# Patient Record
Sex: Male | Born: 1945 | Race: White | Hispanic: No | Marital: Married | Smoking: Never smoker
Health system: Southern US, Community
[De-identification: ages and names within clinical notes are randomized; demographics above are authoritative.]

## PROBLEM LIST (undated history)

## (undated) DIAGNOSIS — I10 Essential (primary) hypertension: Secondary | ICD-10-CM

## (undated) DIAGNOSIS — N200 Calculus of kidney: Secondary | ICD-10-CM

## (undated) DIAGNOSIS — F419 Anxiety disorder, unspecified: Secondary | ICD-10-CM

## (undated) DIAGNOSIS — K219 Gastro-esophageal reflux disease without esophagitis: Secondary | ICD-10-CM

## (undated) DIAGNOSIS — E785 Hyperlipidemia, unspecified: Secondary | ICD-10-CM

## (undated) HISTORY — DX: Anxiety disorder, unspecified: F41.9

## (undated) HISTORY — DX: Gastro-esophageal reflux disease without esophagitis: K21.9

## (undated) HISTORY — DX: Calculus of kidney: N20.0

## (undated) HISTORY — DX: Hyperlipidemia, unspecified: E78.5

## (undated) HISTORY — DX: Essential (primary) hypertension: I10

---

## 2009-08-09 HISTORY — PX: EXTRACORPOREAL SHOCK WAVE LITHOTRIPSY: SHX1557

## 2010-01-28 ENCOUNTER — Emergency Department: Payer: Self-pay | Admitting: Emergency Medicine

## 2010-02-12 ENCOUNTER — Ambulatory Visit: Payer: Self-pay | Admitting: Urology

## 2010-02-20 ENCOUNTER — Ambulatory Visit: Payer: Self-pay | Admitting: Urology

## 2010-03-04 ENCOUNTER — Ambulatory Visit: Payer: Self-pay | Admitting: Urology

## 2010-05-11 ENCOUNTER — Ambulatory Visit: Payer: Self-pay | Admitting: General Practice

## 2010-08-17 ENCOUNTER — Ambulatory Visit: Payer: Self-pay | Admitting: Unknown Physician Specialty

## 2011-01-23 ENCOUNTER — Emergency Department: Payer: Self-pay | Admitting: Emergency Medicine

## 2013-10-14 ENCOUNTER — Emergency Department: Payer: Self-pay | Admitting: Emergency Medicine

## 2017-09-07 ENCOUNTER — Emergency Department: Payer: Medicare Other

## 2017-09-07 ENCOUNTER — Emergency Department
Admission: EM | Admit: 2017-09-07 | Discharge: 2017-09-08 | Disposition: A | Discharge status: Home / Self Care | Payer: Medicare Other | Attending: Emergency Medicine | Admitting: Emergency Medicine

## 2017-09-07 ENCOUNTER — Other Ambulatory Visit: Payer: Self-pay

## 2017-09-07 DIAGNOSIS — N200 Calculus of kidney: Secondary | ICD-10-CM | POA: Diagnosis not present

## 2017-09-07 DIAGNOSIS — R1012 Left upper quadrant pain: Secondary | ICD-10-CM | POA: Diagnosis present

## 2017-09-07 LAB — CBC
HEMATOCRIT: 40.5 % (ref 40.0–52.0)
HEMOGLOBIN: 13.8 g/dL (ref 13.0–18.0)
MCH: 30.9 pg (ref 26.0–34.0)
MCHC: 34.2 g/dL (ref 32.0–36.0)
MCV: 90.3 fL (ref 80.0–100.0)
Platelets: 212 10*3/uL (ref 150–440)
RBC: 4.48 MIL/uL (ref 4.40–5.90)
RDW: 13.3 % (ref 11.5–14.5)
WBC: 7.3 10*3/uL (ref 3.8–10.6)

## 2017-09-07 LAB — COMPREHENSIVE METABOLIC PANEL
ALK PHOS: 49 U/L (ref 38–126)
ALT: 13 U/L — ABNORMAL LOW (ref 17–63)
ANION GAP: 9 (ref 5–15)
AST: 26 U/L (ref 15–41)
Albumin: 3.8 g/dL (ref 3.5–5.0)
BILIRUBIN TOTAL: 0.8 mg/dL (ref 0.3–1.2)
BUN: 16 mg/dL (ref 6–20)
CALCIUM: 9 mg/dL (ref 8.9–10.3)
CO2: 23 mmol/L (ref 22–32)
Chloride: 100 mmol/L — ABNORMAL LOW (ref 101–111)
Creatinine, Ser: 1.59 mg/dL — ABNORMAL HIGH (ref 0.61–1.24)
GFR, EST AFRICAN AMERICAN: 48 mL/min — AB (ref >60)
GFR, EST NON AFRICAN AMERICAN: 42 mL/min — AB (ref >60)
GLUCOSE: 121 mg/dL — AB (ref 65–99)
Potassium: 4 mmol/L (ref 3.5–5.1)
Sodium: 132 mmol/L — ABNORMAL LOW (ref 135–145)
TOTAL PROTEIN: 7.1 g/dL (ref 6.5–8.1)

## 2017-09-07 LAB — LIPASE, BLOOD: LIPASE: 111 U/L — AB (ref 11–51)

## 2017-09-07 NOTE — ED Provider Notes (Signed)
[facility] Emergency Department Provider Note  ____________________________________________   First MD Initiated Contact with Patient 09/07/17 2358     (approximate)  I have reviewed the triage vital signs and the nursing notes.   HISTORY  Chief Complaint Flank Pain    HPI Alexander Velazquez is a 72 y.o. male who self presents to the emergency department with sudden onset severe left flank pain radiating to his left groin that began several hours prior to arrival.  Associated with nausea.  Pacing around the room seems to improve the symptoms somewhat nothing in particular seems to make it worse.  He was seen in clinic earlier today and was diagnosed presumptively with a kidney stone and was prescribed ciprofloxacin.  He denies fevers or chills.  He denies dysuria.  He does have a history of renal colic in the past.  This feels similar although the pain is higher up in his back.  No past medical history on file.  There are no active problems to display for this patient.     Prior to Admission medications   Medication Sig Start Date End Date Taking? Authorizing Provider  HYDROcodone-acetaminophen (NORCO) 5-325 MG tablet Take 1 tablet by mouth every 6 (six) hours as needed for up to 15 doses for severe pain. 09/08/17   Merrily Brittleifenbark, Crystale Giannattasio, MD  tamsulosin (FLOMAX) 0.4 MG CAPS capsule Take 1 capsule (0.4 mg total) by mouth daily. 09/08/17   Merrily Brittleifenbark, Riverlyn Kizziah, MD    Allergies Darvon [propoxyphene]  No family history on file.  Social History Social History   Tobacco Use  . Smoking status: Not on file  Substance Use Topics  . Alcohol use: Not on file  . Drug use: Not on file    Review of Systems Constitutional: No fever/chills Eyes: No visual changes. ENT: No sore throat. Cardiovascular: Denies chest pain. Respiratory: Denies shortness of breath. Gastrointestinal: Positive for abdominal pain.  Positive for nausea, no vomiting.  No diarrhea.  No  constipation. Genitourinary: Negative for dysuria. Musculoskeletal: Positive for back pain. Skin: Negative for rash. Neurological: Negative for headaches, focal weakness or numbness.   ____________________________________________   PHYSICAL EXAM:  VITAL SIGNS: ED Triage Vitals  Enc Vitals Group     BP 09/07/17 2136 (!) 151/79     Pulse Rate 09/07/17 2136 81     Resp 09/07/17 2136 20     Temp 09/07/17 2136 97.9 F (36.6 C)     Temp Source 09/07/17 2136 Oral     SpO2 09/07/17 2136 98 %     Weight 09/07/17 2138 166 lb (75.3 kg)     Height 09/07/17 2138 5\' 6"  (1.676 m)     Head Circumference --      Peak Flow --      Pain Score 09/07/17 2147 7     Pain Loc --      Pain Edu? --      Excl. in GC? --     Constitutional: Alert and oriented x4 well-appearing nontoxic no diaphoresis speaks in full clear sentences Eyes: PERRL EOMI. Head: Atraumatic. Nose: No congestion/rhinnorhea. Mouth/Throat: No trismus Neck: No stridor.   Cardiovascular: Normal rate, regular rhythm. Grossly normal heart sounds.  Good peripheral circulation. Respiratory: Normal respiratory effort.  No retractions. Lungs CTAB and moving good air Gastrointestinal: Soft nondistended nontender no rebound or guarding no peritonitis Musculoskeletal: No lower extremity edema   Neurologic:  Normal speech and language. No gross focal neurologic deficits are appreciated. Skin:  Skin is warm,  dry and intact. No rash noted. Psychiatric: Mood and affect are normal. Speech and behavior are normal.    ____________________________________________   DIFFERENTIAL includes but not limited to  Renal colic, pyelonephritis, AAA, infected stone ____________________________________________   LABS (all labs ordered are listed, but only abnormal results are displayed)  Labs Reviewed  COMPREHENSIVE METABOLIC PANEL - Abnormal; Notable for the following components:      Result Value   Sodium 132 (*)    Chloride 100 (*)     Glucose, Bld 121 (*)    Creatinine, Ser 1.59 (*)    ALT 13 (*)    GFR calc non Af Amer 42 (*)    GFR calc Af Amer 48 (*)    All other components within normal limits  LIPASE, BLOOD - Abnormal; Notable for the following components:   Lipase 111 (*)    All other components within normal limits  URINALYSIS, COMPLETE (UACMP) WITH MICROSCOPIC - Abnormal; Notable for the following components:   Color, Urine YELLOW (*)    APPearance CLEAR (*)    Ketones, ur 5 (*)    Squamous Epithelial / LPF 0-5 (*)    All other components within normal limits  URINE CULTURE  CBC    Lab work reviewed by me with no evidence of infection.  Slight decrease in GFR __________________________________________  EKG   ____________________________________________  RADIOLOGY  CT stone reviewed by me with 7 mm left-sided proximal stone ____________________________________________   PROCEDURES  Procedure(s) performed: no  Procedures  Critical Care performed: no  Observation: no ____________________________________________   INITIAL IMPRESSION / ASSESSMENT AND PLAN / ED COURSE  Pertinent labs & imaging results that were available during my care of the patient were reviewed by me and considered in my medical decision making (see chart for details).  By the time I saw the patient his pain was adequately controlled.  He does have a 7 mm proximal left-sided stone although no evidence of infection.  He had an adverse reaction to tramadol given earlier today so have advised him to stop taking it and will instead prescribe hydrocodone.  Strict return precautions have been given and he verbalizes understanding and agreement the plan.     ____________________________________________   FINAL CLINICAL IMPRESSION(S) / ED DIAGNOSES  Final diagnoses:  Kidney stone      NEW MEDICATIONS STARTED DURING THIS VISIT:  Discharge Medication List as of 09/08/2017  1:01 AM    START taking these medications    Details  HYDROcodone-acetaminophen (NORCO) 5-325 MG tablet Take 1 tablet by mouth every 6 (six) hours as needed for up to 15 doses for severe pain., Starting Thu 09/08/2017, Print    tamsulosin (FLOMAX) 0.4 MG CAPS capsule Take 1 capsule (0.4 mg total) by mouth daily., Starting Thu 09/08/2017, Print         Note:  This document was prepared using Dragon voice recognition software and may include unintentional dictation errors.     Merrily Brittle, MD 09/08/17 [phone]

## 2017-09-07 NOTE — ED Triage Notes (Addendum)
Pt in with co left upper quad pain and left lower back pain states has hx of kidney stones but states pain to upper abd is unusual. Saw pmd today and was told it was kidney stones and started on cipro but was not dx with UTI. Pt has vomited x 3 today.

## 2017-09-08 ENCOUNTER — Telehealth: Payer: Self-pay | Admitting: Urology

## 2017-09-08 DIAGNOSIS — N200 Calculus of kidney: Secondary | ICD-10-CM | POA: Diagnosis not present

## 2017-09-08 LAB — URINALYSIS, COMPLETE (UACMP) WITH MICROSCOPIC
BACTERIA UA: NONE SEEN
Bilirubin Urine: NEGATIVE
GLUCOSE, UA: NEGATIVE mg/dL
Hgb urine dipstick: NEGATIVE
KETONES UR: 5 mg/dL — AB
Leukocytes, UA: NEGATIVE
Nitrite: NEGATIVE
PROTEIN: NEGATIVE mg/dL
Specific Gravity, Urine: 1.006 (ref 1.005–1.030)
pH: 6 (ref 5.0–8.0)

## 2017-09-08 MED ORDER — TAMSULOSIN HCL 0.4 MG PO CAPS: 0.4000 mg | ORAL_CAPSULE | Freq: Every day | ORAL | 0 refills | Status: DC

## 2017-09-08 MED ORDER — KETOROLAC TROMETHAMINE 30 MG/ML IJ SOLN
15.0000 mg | Freq: Once | INTRAMUSCULAR | Status: AC
  Administered 2017-09-08: 15 mg via INTRAVENOUS
  Filled 2017-09-08: qty 1

## 2017-09-08 MED ORDER — ONDANSETRON HCL 4 MG/2ML IJ SOLN
4.0000 mg | Freq: Once | INTRAMUSCULAR | Status: AC
  Administered 2017-09-08: 4 mg via INTRAVENOUS
  Filled 2017-09-08: qty 2

## 2017-09-08 MED ORDER — HYDROCODONE-ACETAMINOPHEN 5-325 MG PO TABS: 1.0000 | ORAL_TABLET | Freq: Four times a day (QID) | ORAL | 0 refills | Status: DC | PRN

## 2017-09-08 MED ORDER — MORPHINE SULFATE (PF) 4 MG/ML IV SOLN
4.0000 mg | Freq: Once | INTRAVENOUS | Status: AC
  Administered 2017-09-08: 4 mg via INTRAVENOUS
  Filled 2017-09-08: qty 1

## 2017-09-08 NOTE — Discharge Instructions (Signed)
Please stop taking her tramadol and take ibuprofen and Norco instead as needed for pain.  Please make an appointment to follow-up with urologist within the next 2-3 days.  Return to the emergency department immediately for any concerns such as fevers or chills.  It was a pleasure to take care of you today, and thank you for coming to our emergency department.  If you have any questions or concerns before leaving please ask the nurse to grab me and I'm more than happy to go through your aftercare instructions again.  If you were prescribed any opioid pain medication today such as Norco, Vicodin, Percocet, morphine, hydrocodone, or oxycodone please make sure you do not drive when you are taking this medication as it can alter your ability to drive safely.  If you have any concerns once you are home that you are not improving or are in fact getting worse before you can make it to your follow-up appointment, please do not hesitate to call 911 and come back for further evaluation.  Merrily Brittle, MD  Results for orders placed or performed during the hospital encounter of 09/07/17  CBC  Result Value Ref Range   WBC 7.3 3.8 - 10.6 K/uL   RBC 4.48 4.40 - 5.90 MIL/uL   Hemoglobin 13.8 13.0 - 18.0 g/dL   HCT 16.1 09.6 - 04.5 %   MCV 90.3 80.0 - 100.0 fL   MCH 30.9 26.0 - 34.0 pg   MCHC 34.2 32.0 - 36.0 g/dL   RDW 40.9 81.1 - 91.4 %   Platelets 212 150 - 440 K/uL  Comprehensive metabolic panel  Result Value Ref Range   Sodium 132 (L) 135 - 145 mmol/L   Potassium 4.0 3.5 - 5.1 mmol/L   Chloride 100 (L) 101 - 111 mmol/L   CO2 23 22 - 32 mmol/L   Glucose, Bld 121 (H) 65 - 99 mg/dL   BUN 16 6 - 20 mg/dL   Creatinine, Ser 7.82 (H) 0.61 - 1.24 mg/dL   Calcium 9.0 8.9 - 95.6 mg/dL   Total Protein 7.1 6.5 - 8.1 g/dL   Albumin 3.8 3.5 - 5.0 g/dL   AST 26 15 - 41 U/L   ALT 13 (L) 17 - 63 U/L   Alkaline Phosphatase 49 38 - 126 U/L   Total Bilirubin 0.8 0.3 - 1.2 mg/dL   GFR calc non Af Amer 42 (L)  >60 mL/min   GFR calc Af Amer 48 (L) >60 mL/min   Anion gap 9 5 - 15  Lipase, blood  Result Value Ref Range   Lipase 111 (H) 11 - 51 U/L  Urinalysis, Complete w Microscopic  Result Value Ref Range   Color, Urine YELLOW (A) YELLOW   APPearance CLEAR (A) CLEAR   Specific Gravity, Urine 1.006 1.005 - 1.030   pH 6.0 5.0 - 8.0   Glucose, UA NEGATIVE NEGATIVE mg/dL   Hgb urine dipstick NEGATIVE NEGATIVE   Bilirubin Urine NEGATIVE NEGATIVE   Ketones, ur 5 (A) NEGATIVE mg/dL   Protein, ur NEGATIVE NEGATIVE mg/dL   Nitrite NEGATIVE NEGATIVE   Leukocytes, UA NEGATIVE NEGATIVE   RBC / HPF 0-5 0 - 5 RBC/hpf   WBC, UA 0-5 0 - 5 WBC/hpf   Bacteria, UA NONE SEEN NONE SEEN   Squamous Epithelial / LPF 0-5 (A) NONE SEEN   Mucus PRESENT    Ct Renal Stone Study  Result Date: 09/08/2017 CLINICAL DATA:  Left upper quadrant pain and left lower back pain.  History of kidney stones. EXAM: CT ABDOMEN AND PELVIS WITHOUT CONTRAST TECHNIQUE: Multidetector CT imaging of the abdomen and pelvis was performed following the standard protocol without IV contrast. COMPARISON:  CT abdomen dated 01/28/2010. FINDINGS: Lower chest: No acute abnormality. Hepatobiliary: No focal liver abnormality is seen. No gallstones, gallbladder wall thickening, or biliary dilatation. Pancreas: Unremarkable. No pancreatic ductal dilatation or surrounding inflammatory changes. Spleen: Normal in size without focal abnormality. Adrenals/Urinary Tract: 7 x 5 mm stone within the proximal left ureter causing moderate left-sided hydronephrosis and perinephric edema. Several additional left renal stones, measuring 1-3 mm in size. No right-sided renal stone or hydronephrosis. Bilateral renal cysts. Bladder is unremarkable. Stomach/Bowel: Bowel is normal in caliber. No bowel wall thickening or evidence of bowel wall inflammation. Diverticulosis of the sigmoid colon without evidence of acute diverticulitis. Stomach appears normal. Appendix is normal.  Vascular/Lymphatic: Aortic atherosclerosis. No enlarged abdominal or pelvic lymph nodes. Reproductive: Prostate gland is mildly enlarged. Other: No abscess collection.  No free intraperitoneal air. Musculoskeletal: Mild degenerative change throughout the thoracolumbar spine. No acute or suspicious osseous finding. IMPRESSION: 1. Obstructing 7 x 5 mm stone within the proximal left ureter, located at the L4 vertebral body level, causing moderate left-sided hydronephrosis and associated perinephric edema. 2. Left nephrolithiasis. 3. Sigmoid colon diverticulosis without evidence of acute diverticulitis. 4. Prostate gland mildly prominent in size. Recommend correlation with PSA lab values. 5. Aortic atherosclerosis. Electronically Signed   By: Bary RichardStan  Maynard M.D.   On: 09/08/2017 00:12

## 2017-09-08 NOTE — Telephone Encounter (Signed)
App made 

## 2017-09-08 NOTE — Telephone Encounter (Signed)
-----   Message from [name], MD sent at 09/08/2017  8:43 AM EST ----- Regarding: FW: stone follow up This patient needs to be seen early next week.  May be good litho candidate.  [name], MD  ----- Message ----- From: Merrily Brittleifenbark, Neil, MD Sent: 09/08/2017   1:01 AM To: [name], MD Subject: stone follow up                                Hi Dr. Apolinar JunesBrandon,  I saw Mr. Alexander KiefWelsh tonight at [facility] and he has a proximal 7mm stone.  I was hoping to get him follow up sometime this week for a recheck.  Thanks so much, Lloyd HugerNeil

## 2017-09-09 LAB — URINE CULTURE: CULTURE: NO GROWTH

## 2017-09-13 ENCOUNTER — Ambulatory Visit
Admission: RE | Admit: 2017-09-13 | Discharge: 2017-09-13 | Disposition: A | Discharge status: Home / Self Care | Payer: Medicare Other | Source: Ambulatory Visit | Attending: Urology | Admitting: Urology

## 2017-09-13 ENCOUNTER — Ambulatory Visit (INDEPENDENT_AMBULATORY_CARE_PROVIDER_SITE_OTHER): Payer: Medicare Other | Admitting: Urology

## 2017-09-13 ENCOUNTER — Encounter: Payer: Self-pay | Admitting: Urology

## 2017-09-13 VITALS — BP 178/81 | HR 91 | Ht 66.0 in | Wt 165.0 lb

## 2017-09-13 DIAGNOSIS — E785 Hyperlipidemia, unspecified: Secondary | ICD-10-CM

## 2017-09-13 DIAGNOSIS — I10 Essential (primary) hypertension: Secondary | ICD-10-CM

## 2017-09-13 DIAGNOSIS — N179 Acute kidney failure, unspecified: Secondary | ICD-10-CM

## 2017-09-13 DIAGNOSIS — N200 Calculus of kidney: Secondary | ICD-10-CM

## 2017-09-13 DIAGNOSIS — N201 Calculus of ureter: Secondary | ICD-10-CM

## 2017-09-13 HISTORY — DX: Hyperlipidemia, unspecified: E78.5

## 2017-09-13 HISTORY — DX: Essential (primary) hypertension: I10

## 2017-09-13 LAB — URINALYSIS, COMPLETE
BILIRUBIN UA: NEGATIVE
Glucose, UA: NEGATIVE
KETONES UA: NEGATIVE
NITRITE UA: NEGATIVE
PH UA: 6.5 (ref 5.0–7.5)
Protein, UA: NEGATIVE
SPEC GRAV UA: 1.015 (ref 1.005–1.030)
Urobilinogen, Ur: 0.2 mg/dL (ref 0.2–1.0)

## 2017-09-13 LAB — MICROSCOPIC EXAMINATION: EPITHELIAL CELLS (NON RENAL): NONE SEEN /HPF (ref 0–10)

## 2017-09-13 NOTE — H&P (View-Only) (Signed)
09/13/2017 9:17 PM   Alexander Velazquez 1946-03-29 [id number]  Referring provider: Jerl Mina, MD [address] [facility] [place], [place] [address]  Chief Complaint  Patient presents with  . Nephrolithiasis    New Patient    HPI: 72 year old male who presents today for further evaluation of an obstructing proximal he 1 cm left proximal ureteral stone.  He was seen and evaluated initially in the emergency room on 09/07/2017.  At this point in time, he developed acute onset left flank rating until his left lower abdomen down to his left groin.  This was associated with nausea and vomiting.  Noncontrast CT scan revealed a 7 x 5 mm stone in the left proximal ureter with left moderate hydronephrosis and perinephric edema (more consistent with 10 mm x 7 mm on my personal measurement).  Hounsfield units proximal 1300.   At the emergency room visit, he did have a mildly elevated creatinine to 1.59 without a significant leukocytosis and a negative UA other than for microscopic blood.  Incidentally, he was seen earlier at urgent care where he was started on Cirpo as a precaution despite a bland appearing urine and negative urine culture.  He denies any significant urinary symptoms including no hematuria, dysuria, urgency or frequency.  No fevers or chills.  Since his ER visit, he has had minimal to no pain.  He is taking regular narcotics despite having minimal discomfort.  He is taking Flomax as well.  He has a personal history of nephrolithiasis.  He had a successful shockwave lithotripsy in 2011 with a larger UPJ stone with similar Hounsfield units.   PMH: Past Medical History:  Diagnosis Date  . Anxiety   . GERD (gastroesophageal reflux disease)   . Hyperlipidemia, unspecified 09/13/2017  . Hypertension 09/13/2017  . Nephrolithiasis     Surgical History: Past Surgical History:  Procedure Laterality Date  . EXTRACORPOREAL SHOCK WAVE LITHOTRIPSY  2011    Home Medications:    Allergies as of 09/13/2017      Reactions   Darvon [propoxyphene] Nausea And Vomiting      Medication List        Accurate as of 09/13/17  9:17 PM. Always use your most recent med list.          ciprofloxacin 250 MG tablet Commonly known as:  CIPRO   HYDROcodone-acetaminophen 5-325 MG tablet Commonly known as:  NORCO Take 1 tablet by mouth every 6 (six) hours as needed for up to 15 doses for severe pain.   promethazine 25 MG tablet Commonly known as:  PHENERGAN   tamsulosin 0.4 MG Caps capsule Commonly known as:  FLOMAX Take 1 capsule (0.4 mg total) by mouth daily.   traMADol 50 MG tablet Commonly known as:  ULTRAM       Allergies:  Allergies  Allergen Reactions  . Darvon [Propoxyphene] Nausea And Vomiting    Family History: Family History  Problem Relation Age of Onset  . Prostate cancer Neg Hx   . Bladder Cancer Neg Hx   . Kidney cancer Neg Hx     Social History:  reports that  has never smoked. he has never used smokeless tobacco. He reports that he does not drink alcohol or use drugs.  ROS: UROLOGY Frequent Urination?: No Hard to postpone urination?: No Burning/pain with urination?: Yes Get up at night to urinate?: No Leakage of urine?: No Urine stream starts and stops?: Yes Trouble starting stream?: Yes Do you have to strain to urinate?:  No Blood in urine?: No Urinary tract infection?: No Sexually transmitted disease?: No Injury to kidneys or bladder?: No Painful intercourse?: Yes Weak stream?: No Erection problems?: Yes Penile pain?: No  Gastrointestinal Nausea?: No Vomiting?: No Indigestion/heartburn?: Yes Diarrhea?: No Constipation?: No  Constitutional Fever: No Night sweats?: No Weight loss?: No Fatigue?: No  Skin Skin rash/lesions?: No Itching?: No  Eyes Blurred vision?: No Double vision?: No  Ears/Nose/Throat Sore throat?: No Sinus problems?: No  Hematologic/Lymphatic Swollen glands?: No Easy bruising?:  Yes  Cardiovascular Leg swelling?: No Chest pain?: No  Respiratory Cough?: No Shortness of breath?: No  Endocrine Excessive thirst?: No  Musculoskeletal Back pain?: No Joint pain?: No  Neurological Headaches?: No Dizziness?: No  Psychologic Depression?: No Anxiety?: No  Physical Exam: BP (!) 178/81   Pulse 91   Ht 5\' 6"  (1.676 m)   Wt 165 lb (74.8 kg)   BMI 26.63 kg/m   Constitutional:  Alert and oriented, No acute distress.  Accompanied wife today. HEENT: [place] AT, moist mucus membranes.  Trachea midline, no masses. Cardiovascular: No clubbing, cyanosis, or edema. Respiratory: Normal respiratory effort, no increased work of breathing. GI: Abdomen is soft, nontender, nondistended, no abdominal masses GU: No CVA tenderness. Skin: No rashes, bruises or suspicious lesions. Neurologic: Grossly intact, no focal deficits, moving all 4 extremities. Psychiatric: Normal mood and affect.  Laboratory Data: Lab Results  Component Value Date   WBC 7.3 09/07/2017   HGB 13.8 09/07/2017   HCT 40.5 09/07/2017   MCV 90.3 09/07/2017   PLT 212 09/07/2017    Lab Results  Component Value Date   CREATININE 1.59 (H) 09/07/2017    Urinalysis Lab Results  Component Value Date   SPECGRAV 1.015 09/13/2017   PHUR 6.5 09/13/2017   COLORU Yellow 09/13/2017   APPEARANCEUR Clear 09/13/2017   LEUKOCYTESUR 1+ (A) 09/13/2017   PROTEINUR Negative 09/13/2017   GLUCOSEU Negative 09/13/2017   KETONESU Negative 09/13/2017   RBCU 1+ (A) 09/13/2017   BILIRUBINUR Negative 09/13/2017   UUROB 0.2 09/13/2017   NITRITE Negative 09/13/2017    Lab Results  Component Value Date   LABMICR See below: 09/13/2017   WBCUA 0-5 09/13/2017   RBCUA 3-10 (A) 09/13/2017   LABEPIT None seen 09/13/2017   MUCUS Present (A) 09/13/2017   BACTERIA Few (A) 09/13/2017    Pertinent Imaging: Results for orders placed during the hospital encounter of 09/07/17  CT Renal Stone Study   Narrative CLINICAL  DATA:  Left upper quadrant pain and left lower back pain. History of kidney stones.  EXAM: CT ABDOMEN AND PELVIS WITHOUT CONTRAST  TECHNIQUE: Multidetector CT imaging of the abdomen and pelvis was performed following the standard protocol without IV contrast.  COMPARISON:  CT abdomen dated 01/28/2010.  FINDINGS: Lower chest: No acute abnormality.  Hepatobiliary: No focal liver abnormality is seen. No gallstones, gallbladder wall thickening, or biliary dilatation.  Pancreas: Unremarkable. No pancreatic ductal dilatation or surrounding inflammatory changes.  Spleen: Normal in size without focal abnormality.  Adrenals/Urinary Tract: 7 x 5 mm stone within the proximal left ureter causing moderate left-sided hydronephrosis and perinephric edema.  Several additional left renal stones, measuring 1-3 mm in size. No right-sided renal stone or hydronephrosis. Bilateral renal cysts. Bladder is unremarkable.  Stomach/Bowel: Bowel is normal in caliber. No bowel wall thickening or evidence of bowel wall inflammation. Diverticulosis of the sigmoid colon without evidence of acute diverticulitis. Stomach appears normal. Appendix is normal.  Vascular/Lymphatic: Aortic atherosclerosis. No enlarged abdominal or pelvic lymph nodes.  Reproductive: Prostate gland is mildly enlarged.  Other: No abscess collection.  No free intraperitoneal air.  Musculoskeletal: Mild degenerative change throughout the thoracolumbar spine. No acute or suspicious osseous finding.  IMPRESSION: 1. Obstructing 7 x 5 mm stone within the proximal left ureter, located at the L4 vertebral body level, causing moderate left-sided hydronephrosis and associated perinephric edema. 2. Left nephrolithiasis. 3. Sigmoid colon diverticulosis without evidence of acute diverticulitis. 4. Prostate gland mildly prominent in size. Recommend correlation with PSA lab values. 5. Aortic atherosclerosis.   Electronically  Signed   By: Bary Richard M.D.   On: 09/08/2017 00:12    CT scan personally reviewed today and with the patient and his wife.  Assessment & Plan:    1. Left ureteral stone Large left proximal ureteral calculus, measuring up to 10 cm on my personal measurement.  We discussed that given the size of the stone, is unlikely to pass spontaneously.  Options including medical expulsive therapy, ureteroscopy versus ESWL were reviewed in detail.   End of its of each were also discussed.  KUB today confirms presence/persistence without any progression of his stone.  He is most interested in shockwave lithotripsy.  He is relatively thin and has had good success with shockwave lithotripsy in the past.  Despite the relatively dense stone, I do feel that this is a reasonable option for him.  He understands the risk of possible failure of the procedure and need for further intervention if it does not successfully fragment his stone.  Additional risk including risk of bleeding, infection, damage to surrounding structures, [address], need for multiple procedures also all reviewed.  Consent and paperwork were signed and completed.  - Urinalysis, Complete - DG Abd 1 View; Future  2. Acute kidney injury (HCC) Elevated creatinine likely secondary to poor p.o. tolerance and urinary obstruction Plan for repeat creatinine at his follow-up visit after stone is been adequately treated   Schedule L ESWL  Vanna Scotland, MD  [facility] [address] [place], [place] [address] [phone]

## 2017-09-13 NOTE — Progress Notes (Signed)
 09/13/2017 9:17 PM   Alexander Velazquez 05/15/1946 [id number]  Referring provider: Hedrick, James, MD [address] [facility] [place], [place] [address]  Chief Complaint  Patient presents with  . Nephrolithiasis    New Patient    HPI: 72-year-old male who presents today for further evaluation of an obstructing proximal he 1 cm left proximal ureteral stone.  He was seen and evaluated initially in the emergency room on 09/07/2017.  At this point in time, he developed acute onset left flank rating until his left lower abdomen down to his left groin.  This was associated with nausea and vomiting.  Noncontrast CT scan revealed a 7 x 5 mm stone in the left proximal ureter with left moderate hydronephrosis and perinephric edema (more consistent with 10 mm x 7 mm on my personal measurement).  Hounsfield units proximal 1300.   At the emergency room visit, he did have a mildly elevated creatinine to 1.59 without a significant leukocytosis and a negative UA other than for microscopic blood.  Incidentally, he was seen earlier at urgent care where he was started on Cirpo as a precaution despite a bland appearing urine and negative urine culture.  He denies any significant urinary symptoms including no hematuria, dysuria, urgency or frequency.  No fevers or chills.  Since his ER visit, he has had minimal to no pain.  He is taking regular narcotics despite having minimal discomfort.  He is taking Flomax as well.  He has a personal history of nephrolithiasis.  He had a successful shockwave lithotripsy in 2011 with a larger UPJ stone with similar Hounsfield units.   PMH: Past Medical History:  Diagnosis Date  . Anxiety   . GERD (gastroesophageal reflux disease)   . Hyperlipidemia, unspecified 09/13/2017  . Hypertension 09/13/2017  . Nephrolithiasis     Surgical History: Past Surgical History:  Procedure Laterality Date  . EXTRACORPOREAL SHOCK WAVE LITHOTRIPSY  2011    Home Medications:    Allergies as of 09/13/2017      Reactions   Darvon [propoxyphene] Nausea And Vomiting      Medication List        Accurate as of 09/13/17  9:17 PM. Always use your most recent med list.          ciprofloxacin 250 MG tablet Commonly known as:  CIPRO   HYDROcodone-acetaminophen 5-325 MG tablet Commonly known as:  NORCO Take 1 tablet by mouth every 6 (six) hours as needed for up to 15 doses for severe pain.   promethazine 25 MG tablet Commonly known as:  PHENERGAN   tamsulosin 0.4 MG Caps capsule Commonly known as:  FLOMAX Take 1 capsule (0.4 mg total) by mouth daily.   traMADol 50 MG tablet Commonly known as:  ULTRAM       Allergies:  Allergies  Allergen Reactions  . Darvon [Propoxyphene] Nausea And Vomiting    Family History: Family History  Problem Relation Age of Onset  . Prostate cancer Neg Hx   . Bladder Cancer Neg Hx   . Kidney cancer Neg Hx     Social History:  reports that  has never smoked. he has never used smokeless tobacco. He reports that he does not drink alcohol or use drugs.  ROS: UROLOGY Frequent Urination?: No Hard to postpone urination?: No Burning/pain with urination?: Yes Get up at night to urinate?: No Leakage of urine?: No Urine stream starts and stops?: Yes Trouble starting stream?: Yes Do you have to strain to urinate?:   No Blood in urine?: No Urinary tract infection?: No Sexually transmitted disease?: No Injury to kidneys or bladder?: No Painful intercourse?: Yes Weak stream?: No Erection problems?: Yes Penile pain?: No  Gastrointestinal Nausea?: No Vomiting?: No Indigestion/heartburn?: Yes Diarrhea?: No Constipation?: No  Constitutional Fever: No Night sweats?: No Weight loss?: No Fatigue?: No  Skin Skin rash/lesions?: No Itching?: No  Eyes Blurred vision?: No Double vision?: No  Ears/Nose/Throat Sore throat?: No Sinus problems?: No  Hematologic/Lymphatic Swollen glands?: No Easy bruising?:  Yes  Cardiovascular Leg swelling?: No Chest pain?: No  Respiratory Cough?: No Shortness of breath?: No  Endocrine Excessive thirst?: No  Musculoskeletal Back pain?: No Joint pain?: No  Neurological Headaches?: No Dizziness?: No  Psychologic Depression?: No Anxiety?: No  Physical Exam: BP (!) 178/81   Pulse 91   Ht 5' 6" (1.676 m)   Wt 165 lb (74.8 kg)   BMI 26.63 kg/m   Constitutional:  Alert and oriented, No acute distress.  Accompanied wife today. HEENT: [place] AT, moist mucus membranes.  Trachea midline, no masses. Cardiovascular: No clubbing, cyanosis, or edema. Respiratory: Normal respiratory effort, no increased work of breathing. GI: Abdomen is soft, nontender, nondistended, no abdominal masses GU: No CVA tenderness. Skin: No rashes, bruises or suspicious lesions. Neurologic: Grossly intact, no focal deficits, moving all 4 extremities. Psychiatric: Normal mood and affect.  Laboratory Data: Lab Results  Component Value Date   WBC 7.3 09/07/2017   HGB 13.8 09/07/2017   HCT 40.5 09/07/2017   MCV 90.3 09/07/2017   PLT 212 09/07/2017    Lab Results  Component Value Date   CREATININE 1.59 (H) 09/07/2017    Urinalysis Lab Results  Component Value Date   SPECGRAV 1.015 09/13/2017   PHUR 6.5 09/13/2017   COLORU Yellow 09/13/2017   APPEARANCEUR Clear 09/13/2017   LEUKOCYTESUR 1+ (A) 09/13/2017   PROTEINUR Negative 09/13/2017   GLUCOSEU Negative 09/13/2017   KETONESU Negative 09/13/2017   RBCU 1+ (A) 09/13/2017   BILIRUBINUR Negative 09/13/2017   UUROB 0.2 09/13/2017   NITRITE Negative 09/13/2017    Lab Results  Component Value Date   LABMICR See below: 09/13/2017   WBCUA 0-5 09/13/2017   RBCUA 3-10 (A) 09/13/2017   LABEPIT None seen 09/13/2017   MUCUS Present (A) 09/13/2017   BACTERIA Few (A) 09/13/2017    Pertinent Imaging: Results for orders placed during the hospital encounter of 09/07/17  CT Renal Stone Study   Narrative CLINICAL  DATA:  Left upper quadrant pain and left lower back pain. History of kidney stones.  EXAM: CT ABDOMEN AND PELVIS WITHOUT CONTRAST  TECHNIQUE: Multidetector CT imaging of the abdomen and pelvis was performed following the standard protocol without IV contrast.  COMPARISON:  CT abdomen dated 01/28/2010.  FINDINGS: Lower chest: No acute abnormality.  Hepatobiliary: No focal liver abnormality is seen. No gallstones, gallbladder wall thickening, or biliary dilatation.  Pancreas: Unremarkable. No pancreatic ductal dilatation or surrounding inflammatory changes.  Spleen: Normal in size without focal abnormality.  Adrenals/Urinary Tract: 7 x 5 mm stone within the proximal left ureter causing moderate left-sided hydronephrosis and perinephric edema.  Several additional left renal stones, measuring 1-3 mm in size. No right-sided renal stone or hydronephrosis. Bilateral renal cysts. Bladder is unremarkable.  Stomach/Bowel: Bowel is normal in caliber. No bowel wall thickening or evidence of bowel wall inflammation. Diverticulosis of the sigmoid colon without evidence of acute diverticulitis. Stomach appears normal. Appendix is normal.  Vascular/Lymphatic: Aortic atherosclerosis. No enlarged abdominal or pelvic lymph nodes.    Reproductive: Prostate gland is mildly enlarged.  Other: No abscess collection.  No free intraperitoneal air.  Musculoskeletal: Mild degenerative change throughout the thoracolumbar spine. No acute or suspicious osseous finding.  IMPRESSION: 1. Obstructing 7 x 5 mm stone within the proximal left ureter, located at the L4 vertebral body level, causing moderate left-sided hydronephrosis and associated perinephric edema. 2. Left nephrolithiasis. 3. Sigmoid colon diverticulosis without evidence of acute diverticulitis. 4. Prostate gland mildly prominent in size. Recommend correlation with PSA lab values. 5. Aortic atherosclerosis.   Electronically  Signed   By: Stan  Maynard M.D.   On: 09/08/2017 00:12    CT scan personally reviewed today and with the patient and his wife.  Assessment & Plan:    1. Left ureteral stone Large left proximal ureteral calculus, measuring up to 10 cm on my personal measurement.  We discussed that given the size of the stone, is unlikely to pass spontaneously.  Options including medical expulsive therapy, ureteroscopy versus ESWL were reviewed in detail.   End of its of each were also discussed.  KUB today confirms presence/persistence without any progression of his stone.  He is most interested in shockwave lithotripsy.  He is relatively thin and has had good success with shockwave lithotripsy in the past.  Despite the relatively dense stone, I do feel that this is a reasonable option for him.  He understands the risk of possible failure of the procedure and need for further intervention if it does not successfully fragment his stone.  Additional risk including risk of bleeding, infection, damage to surrounding structures, [place], need for multiple procedures also all reviewed.  Consent and paperwork were signed and completed.  - Urinalysis, Complete - DG Abd 1 View; Future  2. Acute kidney injury (HCC) Elevated creatinine likely secondary to poor p.o. tolerance and urinary obstruction Plan for repeat creatinine at his follow-up visit after stone is been adequately treated   Schedule L ESWL  Devesh Monforte, MD  [facility] [address], [address] , [place] [address] [phone]  

## 2017-09-14 ENCOUNTER — Other Ambulatory Visit: Payer: Self-pay | Admitting: Radiology

## 2017-09-14 DIAGNOSIS — N200 Calculus of kidney: Secondary | ICD-10-CM

## 2017-09-14 MED ORDER — CIPROFLOXACIN HCL 500 MG PO TABS
500.0000 mg | ORAL_TABLET | ORAL | Status: AC
  Administered 2017-09-15: 500 mg via ORAL

## 2017-09-15 ENCOUNTER — Ambulatory Visit
Admission: RE | Admit: 2017-09-15 | Discharge: 2017-09-15 | Disposition: A | Discharge status: Home / Self Care | Payer: Medicare Other | Source: Ambulatory Visit | Attending: Urology | Admitting: Urology

## 2017-09-15 ENCOUNTER — Encounter
Admission: RE | Disposition: A | Discharge status: Home / Self Care | Payer: Self-pay | Source: Ambulatory Visit | Attending: Urology

## 2017-09-15 ENCOUNTER — Ambulatory Visit: Payer: Medicare Other

## 2017-09-15 DIAGNOSIS — N201 Calculus of ureter: Secondary | ICD-10-CM

## 2017-09-15 DIAGNOSIS — I700 Atherosclerosis of aorta: Secondary | ICD-10-CM | POA: Insufficient documentation

## 2017-09-15 DIAGNOSIS — F419 Anxiety disorder, unspecified: Secondary | ICD-10-CM | POA: Insufficient documentation

## 2017-09-15 DIAGNOSIS — E785 Hyperlipidemia, unspecified: Secondary | ICD-10-CM | POA: Diagnosis not present

## 2017-09-15 DIAGNOSIS — Z79899 Other long term (current) drug therapy: Secondary | ICD-10-CM | POA: Diagnosis not present

## 2017-09-15 DIAGNOSIS — K5730 Diverticulosis of large intestine without perforation or abscess without bleeding: Secondary | ICD-10-CM | POA: Insufficient documentation

## 2017-09-15 DIAGNOSIS — N179 Acute kidney failure, unspecified: Secondary | ICD-10-CM | POA: Insufficient documentation

## 2017-09-15 DIAGNOSIS — N132 Hydronephrosis with renal and ureteral calculous obstruction: Secondary | ICD-10-CM | POA: Insufficient documentation

## 2017-09-15 DIAGNOSIS — N200 Calculus of kidney: Secondary | ICD-10-CM

## 2017-09-15 DIAGNOSIS — K219 Gastro-esophageal reflux disease without esophagitis: Secondary | ICD-10-CM | POA: Diagnosis not present

## 2017-09-15 DIAGNOSIS — I10 Essential (primary) hypertension: Secondary | ICD-10-CM | POA: Insufficient documentation

## 2017-09-15 DIAGNOSIS — Z87442 Personal history of urinary calculi: Secondary | ICD-10-CM | POA: Insufficient documentation

## 2017-09-15 HISTORY — PX: EXTRACORPOREAL SHOCK WAVE LITHOTRIPSY: SHX1557

## 2017-09-15 SURGERY — LITHOTRIPSY, ESWL
Anesthesia: Moderate Sedation | Laterality: Left

## 2017-09-15 MED ORDER — ONDANSETRON HCL 4 MG/2ML IJ SOLN
INTRAMUSCULAR | Status: AC
  Filled 2017-09-15: qty 2

## 2017-09-15 MED ORDER — TAMSULOSIN HCL 0.4 MG PO CAPS: 0.4000 mg | ORAL_CAPSULE | Freq: Every day | ORAL | 0 refills | Status: DC

## 2017-09-15 MED ORDER — ONDANSETRON HCL 4 MG/2ML IJ SOLN: 4.0000 mg | Freq: Once | INTRAMUSCULAR | Status: DC

## 2017-09-15 MED ORDER — DIAZEPAM 5 MG PO TABS
10.0000 mg | ORAL_TABLET | ORAL | Status: AC
  Administered 2017-09-15: 10 mg via ORAL

## 2017-09-15 MED ORDER — DIAZEPAM 5 MG PO TABS
ORAL_TABLET | ORAL | Status: AC
  Administered 2017-09-15: 10 mg via ORAL
  Filled 2017-09-15: qty 2

## 2017-09-15 MED ORDER — HYDROCODONE-ACETAMINOPHEN 5-325 MG PO TABS: 1.0000 | ORAL_TABLET | ORAL | 0 refills | Status: DC | PRN

## 2017-09-15 MED ORDER — CIPROFLOXACIN HCL 500 MG PO TABS
ORAL_TABLET | ORAL | Status: AC
  Administered 2017-09-15: 500 mg via ORAL
  Filled 2017-09-15: qty 1

## 2017-09-15 MED ORDER — DIPHENHYDRAMINE HCL 25 MG PO CAPS
25.0000 mg | ORAL_CAPSULE | ORAL | Status: AC
  Administered 2017-09-15: 25 mg via ORAL

## 2017-09-15 MED ORDER — SODIUM CHLORIDE 0.9 % IV SOLN
INTRAVENOUS | Status: DC
  Administered 2017-09-15: 08:00:00 via INTRAVENOUS

## 2017-09-15 MED ORDER — DIPHENHYDRAMINE HCL 25 MG PO CAPS
ORAL_CAPSULE | ORAL | Status: AC
  Administered 2017-09-15: 25 mg via ORAL
  Filled 2017-09-15: qty 1

## 2017-09-15 NOTE — Discharge Instructions (Signed)

## 2017-09-15 NOTE — Interval H&P Note (Signed)
History and Physical Interval Note:  09/15/2017 10:54 AM  Alexander Velazquez  has presented today for surgery, with the diagnosis of Kidney Stone  The various methods of treatment have been discussed with the patient and family. After consideration of risks, benefits and other options for treatment, the patient has consented to  Procedure(s): EXTRACORPOREAL SHOCK WAVE LITHOTRIPSY (ESWL) (Left) as a surgical intervention .  The patient's history has been reviewed, patient examined, no change in status, stable for surgery.  I have reviewed the patient's chart and labs.  Questions were answered to the patient's satisfaction.     Floyde Dingley C Amand Lemoine

## 2017-10-05 ENCOUNTER — Encounter: Payer: Self-pay | Admitting: Urology

## 2017-10-05 ENCOUNTER — Ambulatory Visit
Admission: RE | Admit: 2017-10-05 | Discharge: 2017-10-05 | Disposition: A | Discharge status: Home / Self Care | Payer: Medicare Other | Source: Ambulatory Visit | Attending: Urology | Admitting: Urology

## 2017-10-05 ENCOUNTER — Ambulatory Visit (INDEPENDENT_AMBULATORY_CARE_PROVIDER_SITE_OTHER): Payer: Medicare Other | Admitting: Urology

## 2017-10-05 VITALS — BP 163/78 | HR 84 | Ht 66.0 in | Wt 158.2 lb

## 2017-10-05 DIAGNOSIS — N200 Calculus of kidney: Secondary | ICD-10-CM

## 2017-10-05 NOTE — Progress Notes (Signed)
10/05/2017 8:42 AM   Alexander Velazquez 1945-10-22 [id number]  Referring provider: Jerl Mina, MD [address] [facility] [place], [place] [address]  Chief Complaint  Patient presents with  . Follow-up    went for KUB this morning, tech was unable to perform due to no order placed   . Nephrolithiasis    HPI: 72 year old male with recurrent stone disease who is status post shockwave lithotripsy of a 8 mm left proximal ureteral calculus on 09/15/2017.  He states he passed several small stones and gravel over a 2-week.  But has not seen any recently.  He had no significant flank or abdominal pain.  He collected the stones but forgot to bring them in today.  At   PMH: Past Medical History:  Diagnosis Date  . Anxiety   . GERD (gastroesophageal reflux disease)   . Hyperlipidemia, unspecified 09/13/2017  . Hypertension 09/13/2017  . Nephrolithiasis     Surgical History: Past Surgical History:  Procedure Laterality Date  . EXTRACORPOREAL SHOCK WAVE LITHOTRIPSY  2011  . EXTRACORPOREAL SHOCK WAVE LITHOTRIPSY Left 09/15/2017   Procedure: EXTRACORPOREAL SHOCK WAVE LITHOTRIPSY (ESWL);  Surgeon: Riki Altes, MD;  Location: [facility] ORS;  Service: Urology;  Laterality: Left;    Home Medications:  Allergies as of 10/05/2017      Reactions   Darvon [propoxyphene] Nausea And Vomiting      Medication List        Accurate as of 10/05/17  8:42 AM. Always use your most recent med list.          promethazine 25 MG tablet Commonly known as:  PHENERGAN   tamsulosin 0.4 MG Caps capsule Commonly known as:  FLOMAX Take 1 capsule (0.4 mg total) by mouth daily.       Allergies:  Allergies  Allergen Reactions  . Darvon [Propoxyphene] Nausea And Vomiting    Family History: Family History  Problem Relation Age of Onset  . Prostate cancer Neg Hx   . Bladder Cancer Neg Hx   . Kidney cancer Neg Hx     Social History:  reports that  has never smoked. he has never used smokeless  tobacco. He reports that he does not drink alcohol or use drugs.  ROS: UROLOGY Frequent Urination?: No Hard to postpone urination?: No Burning/pain with urination?: No Get up at night to urinate?: No Leakage of urine?: No Urine stream starts and stops?: No Trouble starting stream?: No Do you have to strain to urinate?: No Blood in urine?: No Urinary tract infection?: No Sexually transmitted disease?: No Injury to kidneys or bladder?: No Painful intercourse?: No Weak stream?: No Erection problems?: No Penile pain?: No  Gastrointestinal Nausea?: No Vomiting?: No Indigestion/heartburn?: No Diarrhea?: No Constipation?: No  Constitutional Fever: No Night sweats?: No Weight loss?: No Fatigue?: No  Skin Skin rash/lesions?: No Itching?: No  Eyes Blurred vision?: No Double vision?: No  Ears/Nose/Throat Sore throat?: No Sinus problems?: No  Hematologic/Lymphatic Swollen glands?: No  Cardiovascular Leg swelling?: Yes Chest pain?: No  Respiratory Cough?: No Shortness of breath?: No  Endocrine Excessive thirst?: No  Musculoskeletal Back pain?: No Joint pain?: No  Neurological Headaches?: No Dizziness?: No  Psychologic Depression?: No Anxiety?: No  Physical Exam: BP (!) 163/78 (BP Location: Right Arm, Patient Position: Sitting, Cuff Size: Normal)   Pulse 84   Ht 5\' 6"  (1.676 m)   Wt 158 lb 3.2 oz (71.8 kg)   SpO2 99%   BMI 25.53 kg/m   Constitutional:  Alert and oriented, No acute distress.   Laboratory Data: Lab Results  Component Value Date   WBC 7.3 09/07/2017   HGB 13.8 09/07/2017   HCT 40.5 09/07/2017   MCV 90.3 09/07/2017   PLT 212 09/07/2017    Lab Results  Component Value Date   CREATININE 1.59 (H) 09/07/2017     Assessment & Plan:  Doing well status post shockwave lithotripsy of a left proximal ureteral calculus.  He did not have his KUB performed this morning and was sent to radiology and will be notified with the  results.  We will obtain a follow-up renal ultrasound in approximately 4 weeks.  He did have small 1-3 mm nonobstructing left renal calculi.  I have recommended a metabolic evaluation through [facility].    Riki AltesScott C Alfredo Spong, MD  [facility] [address], [address] [place], [place] [id number] [phone]

## 2017-10-14 ENCOUNTER — Telehealth: Payer: Self-pay

## 2017-10-14 NOTE — Telephone Encounter (Signed)
-----   Message from Riki AltesScott C Stoioff, MD sent at 10/07/2017  9:50 AM EST ----- KUB shows no residual stone fragments.  Proceed with metabolic evaluation as previously discussed

## 2017-10-14 NOTE — Telephone Encounter (Signed)
Letter sent.

## 2017-10-19 ENCOUNTER — Other Ambulatory Visit: Payer: Medicare Other

## 2017-11-18 ENCOUNTER — Other Ambulatory Visit: Payer: Self-pay | Admitting: Urology

## 2017-11-21 ENCOUNTER — Encounter: Payer: Self-pay | Admitting: Family Medicine

## 2017-11-21 ENCOUNTER — Telehealth: Payer: Self-pay | Admitting: Family Medicine

## 2017-11-21 NOTE — Telephone Encounter (Signed)
Letter sent.

## 2017-11-21 NOTE — Telephone Encounter (Signed)
-----   Message from Riki AltesScott C Stoioff, MD sent at 11/20/2017  9:31 AM EDT ----- 24-hour urine study showed no significant abnormalities.  His urine output was good.  Calcium, oxalate and citrate levels were normal.  Would recommend he continue increasing his water intake.  Follow-up as scheduled.

## 2019-10-13 IMAGING — CT CT RENAL STONE PROTOCOL
2 of 4 series · 16 of 46 positions shown, 18 images · non-contrast
Comparison: CT abdomen dated 01/28/2010.

CLINICAL DATA: Left upper quadrant pain and left lower back pain.
History of kidney stones.

EXAM:
CT ABDOMEN AND PELVIS WITHOUT CONTRAST
TECHNIQUE: Multidetector CT imaging of the abdomen and pelvis was performed
following the standard protocol without IV contrast.

[Series 2: stone full standard · axial · 0.75mm/px · z∈[-1056,-616]mm · 13 of 96 slices shown, 15 images]
[im 4/96  soft-tissue]
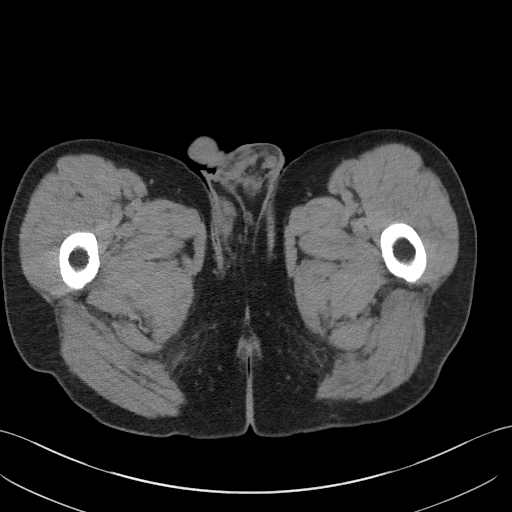
[im 4/96  bone]
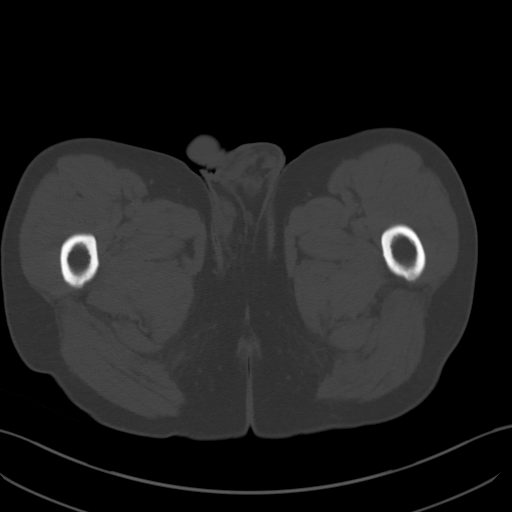
[im 12/96  soft-tissue]
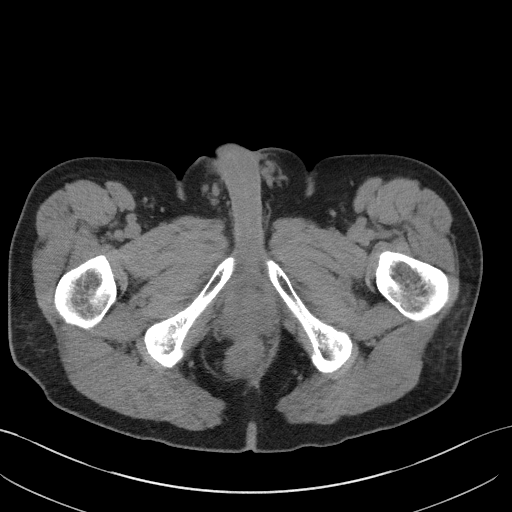
[im 20/96  soft-tissue]
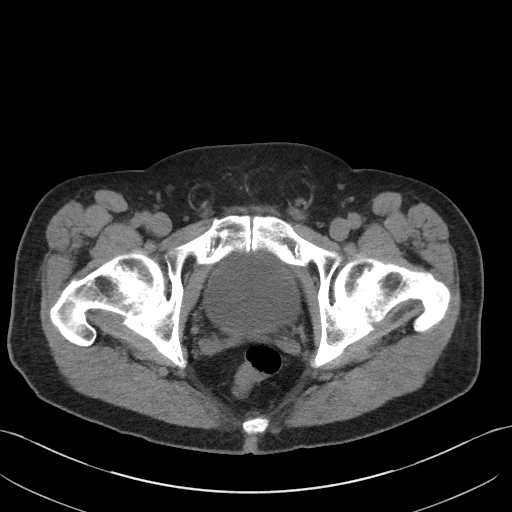
[im 28/96  soft-tissue]
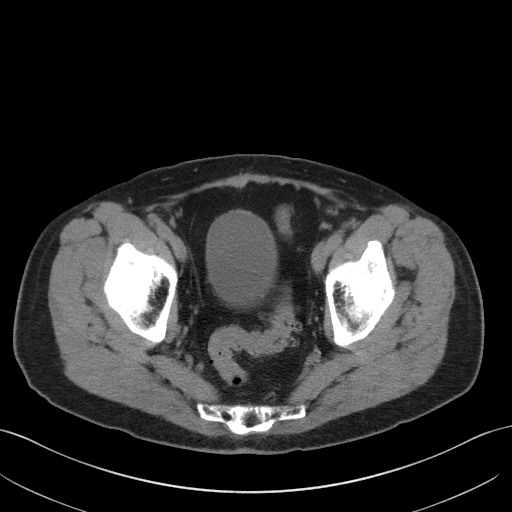
[im 32/96  soft-tissue]
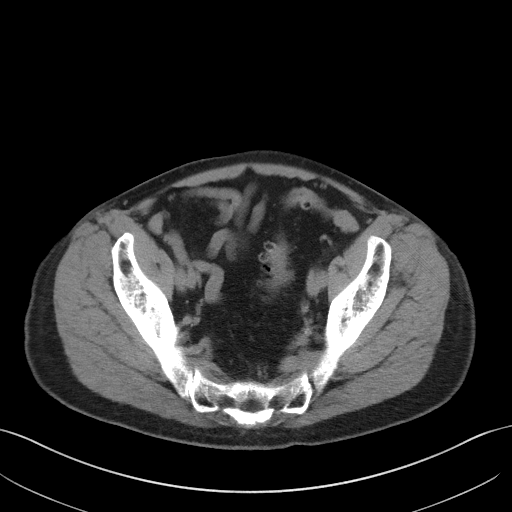
[im 40/96  soft-tissue]
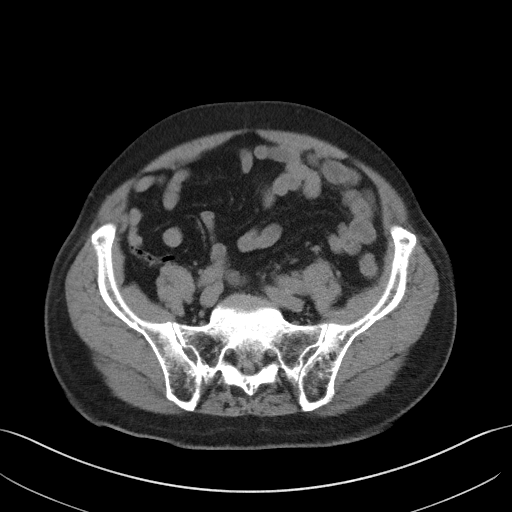
[im 48/96  soft-tissue]
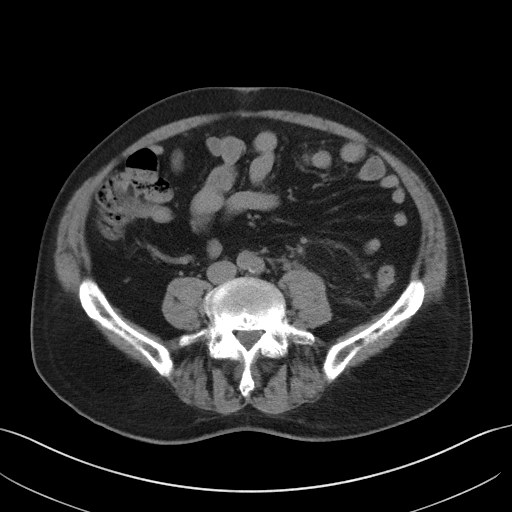
[im 56/96  soft-tissue]
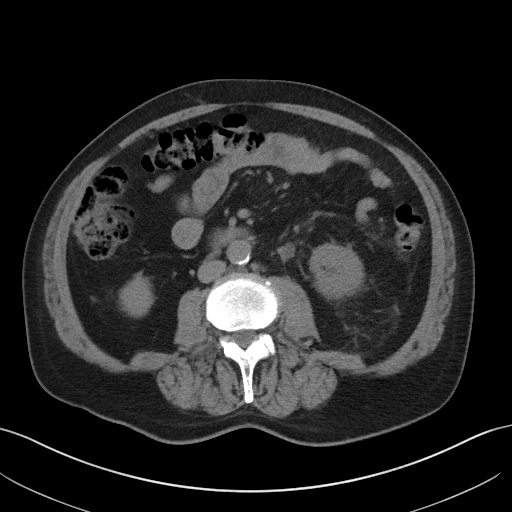
[im 64/96  soft-tissue]
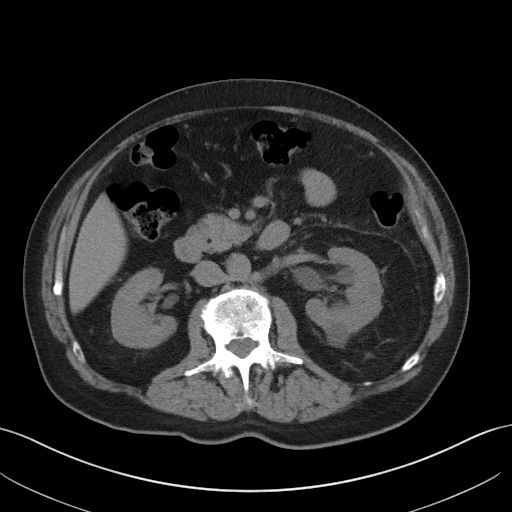
[im 64/96  bone]
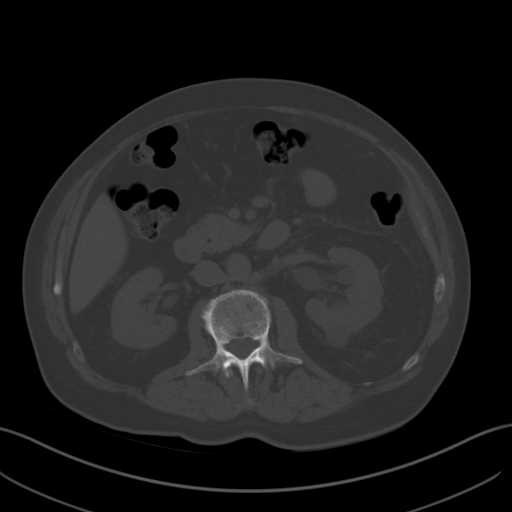
[im 68/96  soft-tissue]
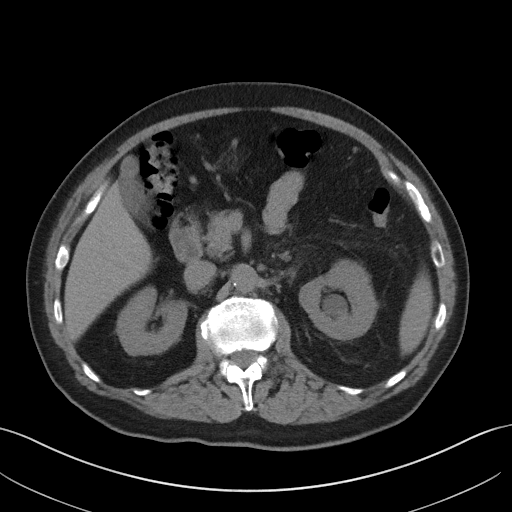
[im 76/96  soft-tissue]
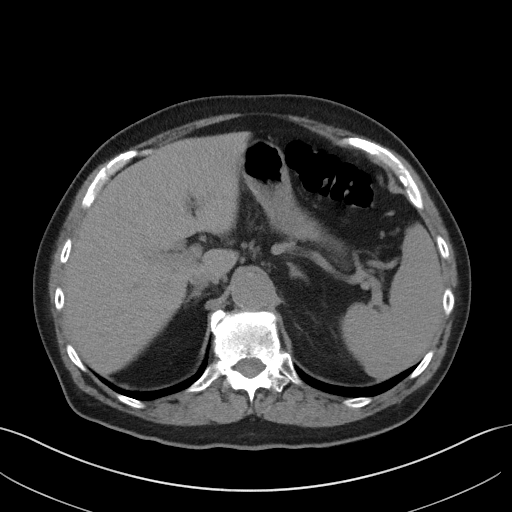
[im 84/96  soft-tissue]
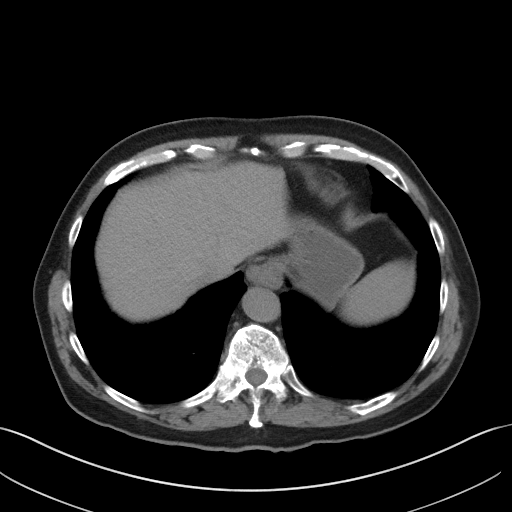
[im 92/96  soft-tissue]
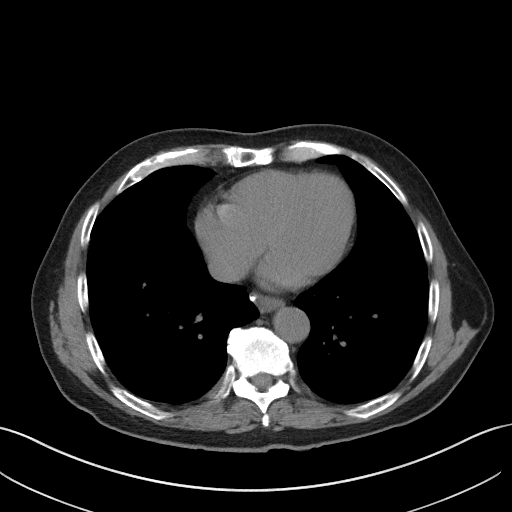

[Series 5: coronal · coronal · 0.70mm/px · 3 of 137 slices shown]
[im 46/137  soft-tissue]
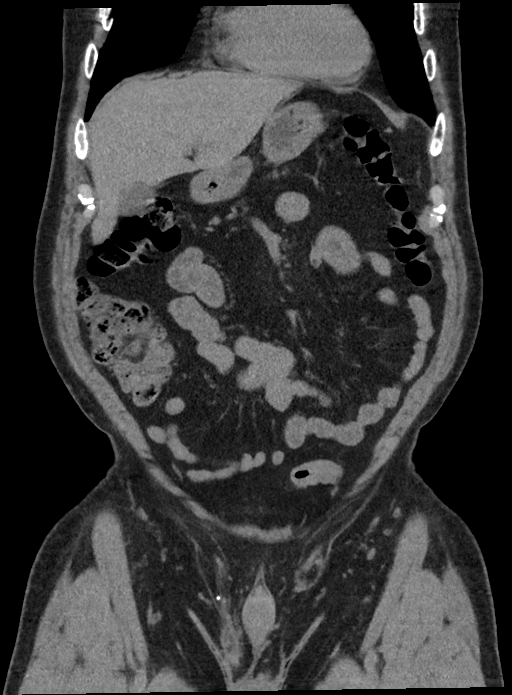
[im 61/137  soft-tissue]
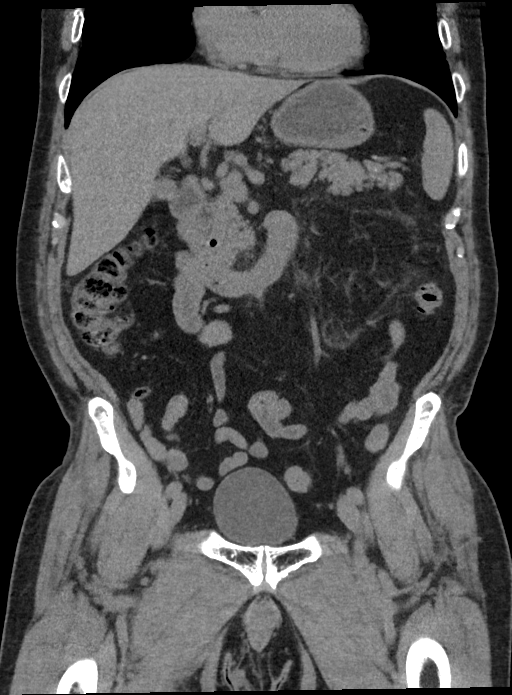
[im 76/137  soft-tissue]
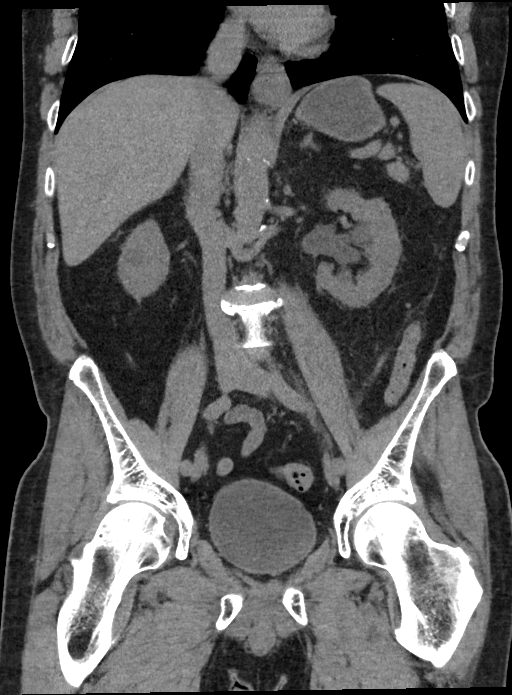

[16 of 46 positions shown; findings below may reference images not displayed]

FINDINGS: Lower chest: No acute abnormality.

Hepatobiliary: No focal liver abnormality is seen. No gallstones,
gallbladder wall thickening, or biliary dilatation.

Pancreas: Unremarkable. No pancreatic ductal dilatation or
surrounding inflammatory changes.

Spleen: Normal in size without focal abnormality.

Adrenals/Urinary Tract: 7 x 5 mm stone within the proximal left
ureter causing moderate left-sided hydronephrosis and perinephric
edema.

Several additional left renal stones, measuring 1-3 mm in size. No
right-sided renal stone or hydronephrosis. Bilateral renal cysts.
Bladder is unremarkable.

Stomach/Bowel: Bowel is normal in caliber. No bowel wall thickening
or evidence of bowel wall inflammation. Diverticulosis of the
sigmoid colon without evidence of acute diverticulitis. Stomach
appears normal. Appendix is normal.

Vascular/Lymphatic: Aortic atherosclerosis. No enlarged abdominal or
pelvic lymph nodes.

Reproductive: Prostate gland is mildly enlarged.

Other: No abscess collection.  No free intraperitoneal air.

Musculoskeletal: Mild degenerative change throughout the
thoracolumbar spine. No acute or suspicious osseous finding.
IMPRESSION: 1. Obstructing 7 x 5 mm stone within the proximal left ureter,
located at the L4 vertebral body level, causing moderate left-sided
hydronephrosis and associated perinephric edema.
2. Left nephrolithiasis.
3. Sigmoid colon diverticulosis without evidence of acute
diverticulitis.
4. Prostate gland mildly prominent in size. Recommend correlation
with PSA lab values.
5. Aortic atherosclerosis.

## 2020-04-16 ENCOUNTER — Ambulatory Visit
Admission: RE | Admit: 2020-04-16 | Discharge: 2020-04-16 | Disposition: A | Discharge status: Home / Self Care | Payer: Medicare Other | Source: Ambulatory Visit | Attending: Gastroenterology | Admitting: Gastroenterology

## 2020-04-16 ENCOUNTER — Other Ambulatory Visit: Payer: Self-pay | Admitting: Gastroenterology

## 2020-04-16 DIAGNOSIS — R1032 Left lower quadrant pain: Secondary | ICD-10-CM | POA: Diagnosis not present

## 2021-04-01 ENCOUNTER — Ambulatory Visit: Payer: Self-pay | Admitting: Surgery

## 2021-04-01 NOTE — H&P (Signed)
Subjective:   CC: Non-recurrent unilateral inguinal hernia without obstruction or gangrene [K40.90]  HPI:  Alexander Velazquez is a 74 y.o. male who was referred by Lucas Mallow, MD for evaluation of above. Symptoms were first noted several years ago. Pain is sharp and intermittent, confined to the right groin, without radiation.  Associated with nothing specific, exacerbated by exertion and eating  Lump is reducible.    Past Medical History:  has a past medical history of Allergy, Hyperlipidemia, Hypertension, Kidney stones, and Migraine.  Past Surgical History:  Past Surgical History:  Procedure Laterality Date   COLONOSCOPY  08/17/2010   Adenomatous Polyp: CBF 08/2015; Recall Ltr mailed 07/14/2015 (dw)   COLONOSCOPY  04/16/2020   Tubular adenoma/Repeat 52yrs/Eldo Umanzor   LITHOTRIPSY      Family History: family history includes Coronary Artery Disease (Blocked arteries around heart) (age of onset: 58) in his brother; Depression in his father; Prostate cancer (age of onset: 56) in his brother; Uterine cancer in his mother.  Social History:  reports that he quit smoking about 62 years ago. His smoking use included cigars. He has never used smokeless tobacco. He reports previous alcohol use. He reports that he does not use drugs.  Current Medications: has a current medication list which includes the following prescription(s): ascorbic acid/collagen hydr, cyanocobalamin, glucosamine-chondroitin, and omega 3-dha-epa-fish oil.  Allergies:  Allergies as of 04/01/2021 - Reviewed 04/01/2021  Allergen Reaction Noted   Codeine Unknown 09/04/2014   Percocet [oxycodone-acetaminophen] Unknown 09/04/2014   Propoxyphene Nausea And Vomiting 09/07/2017    ROS:  A 15 point review of systems was performed and pertinent positives and negatives noted in HPI   Objective:     BP (!) 173/74   Pulse 79   Ht 170.2 cm (5\' 7" )   Wt 68 kg (150 lb)   BMI 23.49 kg/m   Constitutional :  alert, appears stated  age, cooperative and no distress  Lymphatics/Throat:  no asymmetry, masses, or scars  Respiratory:  clear to auscultation bilaterally  Cardiovascular:  regular rate and rhythm  Gastrointestinal: soft, non-tender; bowel sounds normal; no masses,  no organomegaly. inguinal hernia noted.  small, reducible, no overlying skin changes and LEFT, possible very small one on right  Musculoskeletal: Steady gait and movement  Skin: Cool and moist  Psychiatric: Normal affect, non-agitated, not confused       LABS:  n/a   RADS: n/a Assessment:       Non-recurrent unilateral inguinal hernia without obstruction or gangrene [K40.90] LEFT, possibly right  Plan:     1. Non-recurrent unilateral inguinal hernia without obstruction or gangrene [K40.90]   Discussed the risk of surgery including recurrence, which can be up to 50% in the case of incisional or complex hernias, possible use of prosthetic materials (mesh) and the increased risk of mesh infxn if used, bleeding, chronic pain, post-op infxn, post-op SBO or ileus, and possible re-operation to address said risks. The risks of general anesthetic, if used, includes MI, CVA, sudden death or even reaction to anesthetic medications also discussed. Alternatives include continued observation.  Benefits include possible symptom relief, prevention of incarceration, strangulation, enlargement in size over time, and the risk of emergency surgery in the face of strangulation.   Typical post-op recovery time of 3-5 days with 2 weeks of activity restrictions were also discussed.  ED return precautions given for sudden increase in pain, size of hernia with accompanying fever, nausea, and/or vomiting.  The patient verbalized understanding and all questions were answered to  the patient's satisfaction.   2. Patient has elected to proceed with surgical treatment. Procedure will be scheduled. LEFT, possible bilateral, robotic assisted laparoscopic   

## 2021-04-01 NOTE — H&P (View-Only) (Signed)
Subjective:   CC: Non-recurrent unilateral inguinal hernia without obstruction or gangrene [K40.90]  HPI:  Alexander Velazquez is a 75 y.o. male who was referred by Lucas Mallow, MD for evaluation of above. Symptoms were first noted several years ago. Pain is sharp and intermittent, confined to the right groin, without radiation.  Associated with nothing specific, exacerbated by exertion and eating  Lump is reducible.    Past Medical History:  has a past medical history of Allergy, Hyperlipidemia, Hypertension, Kidney stones, and Migraine.  Past Surgical History:  Past Surgical History:  Procedure Laterality Date   COLONOSCOPY  08/17/2010   Adenomatous Polyp: [facility] 08/2015; Recall Ltr mailed 07/14/2015 (dw)   COLONOSCOPY  04/16/2020   Tubular adenoma/Repeat 52yrs/[facility]   LITHOTRIPSY      Family History: family history includes Coronary Artery Disease (Blocked arteries around heart) (age of onset: 58) in his brother; Depression in his father; Prostate cancer (age of onset: 56) in his brother; Uterine cancer in his mother.  Social History:  reports that he quit smoking about 62 years ago. His smoking use included cigars. He has never used smokeless tobacco. He reports previous alcohol use. He reports that he does not use drugs.  Current Medications: has a current medication list which includes the following prescription(s): ascorbic acid/collagen hydr, cyanocobalamin, glucosamine-chondroitin, and omega 3-dha-epa-fish oil.  Allergies:  Allergies as of 04/01/2021 - Reviewed 04/01/2021  Allergen Reaction Noted   Codeine Unknown 09/04/2014   Percocet [oxycodone-acetaminophen] Unknown 09/04/2014   Propoxyphene Nausea And Vomiting 09/07/2017    ROS:  A 15 point review of systems was performed and pertinent positives and negatives noted in HPI   Objective:     BP (!) 173/74   Pulse 79   Ht 170.2 cm (5\' 7" )   Wt 68 kg (150 lb)   BMI 23.49 kg/m   Constitutional :  alert, appears stated  age, cooperative and no distress  Lymphatics/Throat:  no asymmetry, masses, or scars  Respiratory:  clear to auscultation bilaterally  Cardiovascular:  regular rate and rhythm  Gastrointestinal: soft, non-tender; bowel sounds normal; no masses,  no organomegaly. inguinal hernia noted.  small, reducible, no overlying skin changes and LEFT, possible very small one on right  Musculoskeletal: Steady gait and movement  Skin: Cool and moist  Psychiatric: Normal affect, non-agitated, not confused       LABS:  n/a   RADS: n/a Assessment:       Non-recurrent unilateral inguinal hernia without obstruction or gangrene [K40.90] LEFT, possibly right  Plan:     1. Non-recurrent unilateral inguinal hernia without obstruction or gangrene [K40.90]   Discussed the risk of surgery including recurrence, which can be up to 50% in the case of incisional or complex hernias, possible use of prosthetic materials (mesh) and the increased risk of mesh infxn if used, bleeding, chronic pain, post-op infxn, post-op SBO or ileus, and possible re-operation to address said risks. The risks of general anesthetic, if used, includes MI, CVA, sudden death or even reaction to anesthetic medications also discussed. Alternatives include continued observation.  Benefits include possible symptom relief, prevention of incarceration, strangulation, enlargement in size over time, and the risk of emergency surgery in the face of strangulation.   Typical post-op recovery time of 3-5 days with 2 weeks of activity restrictions were also discussed.  ED return precautions given for sudden increase in pain, size of hernia with accompanying fever, nausea, and/or vomiting.  The patient verbalized understanding and all questions were answered to  the patient's satisfaction.   2. Patient has elected to proceed with surgical treatment. Procedure will be scheduled. LEFT, possible bilateral, robotic assisted laparoscopic

## 2021-04-07 ENCOUNTER — Encounter
Admission: RE | Admit: 2021-04-07 | Discharge: 2021-04-07 | Disposition: A | Discharge status: Home / Self Care | Payer: Medicare Other | Source: Ambulatory Visit | Attending: Surgery | Admitting: Surgery

## 2021-04-07 ENCOUNTER — Other Ambulatory Visit: Payer: Self-pay

## 2021-04-07 NOTE — Patient Instructions (Addendum)
Your procedure is scheduled on: 04/10/2021 Report to the Registration Desk on the 1st floor of the [facility]. To find out your arrival time, please call [phone] between 1PM - 3PM on: 04/09/2021  REMEMBER: Instructions that are not followed completely may result in serious medical risk, up to and including death; or upon the discretion of your surgeon and anesthesiologist your surgery may need to be rescheduled.  Do not eat food after midnight the night before surgery.  No gum chewing, lozengers or hard candies.  You may however, drink CLEAR liquids up to 2 hours before you are scheduled to arrive for your surgery. Do not drink anything within 2 hours of your scheduled arrival time.  Clear liquids include: - water  - apple juice without pulp - gatorade (not RED, PURPLE, OR BLUE) - black coffee or tea (Do NOT add milk or creamers to the coffee or tea) Do NOT drink anything that is not on this list.     One week prior to surgery: Stop Anti-inflammatories (NSAIDS) such as Advil, Aleve, Ibuprofen, Motrin, Naproxen, Naprosyn and Aspirin based products such as Excedrin, Goodys Powder, BC Powder Stop ANY OVER THE COUNTER supplements until after surgery like omega 3, multivitamins, any over the counter medication, B-12, Vitamin D3.  You may however, continue to take Tylenol if needed for pain up until the day of surgery.  No Alcohol for 24 hours before or after surgery.  No Smoking including e-cigarettes for 24 hours prior to surgery.  No chewable tobacco products for at least 6 hours prior to surgery.  No nicotine patches on the day of surgery.  Do not use any "recreational" drugs for at least a week prior to your surgery.  Please be advised that the combination of cocaine and anesthesia may have negative outcomes, up to and including death. If you test positive for cocaine, your surgery will be cancelled.  On the morning of surgery brush your teeth with toothpaste and water, you  may rinse your mouth with mouthwash if you wish. Do not swallow any toothpaste or mouthwash.  Do not wear jewelry.  Do not wear lotions, powders, or perfumes.   Do not shave body from the neck down 48 hours prior to surgery just in case you cut yourself which could leave a site for infection.  Also, freshly shaved skin may become irritated if using the CHG soap.  Contact lenses, hearing aids and dentures may not be worn into surgery.  Do not bring valuables to the hospital. [facility] is not responsible for any missing/lost belongings or valuables.   Use CHG Soap or wipes as directed on instruction sheet.  Notify your doctor if there is any change in your medical condition (cold, fever, infection).  Wear comfortable clothing (specific to your surgery type) to the hospital.  After surgery, you can help prevent lung complications by doing breathing exercises.  Take deep breaths and cough every 1-2 hours. Your doctor may order a device called an Incentive Spirometer to help you take deep breaths.  When coughing or sneezing, hold a pillow firmly against your incision with both hands. This is called "splinting." Doing this helps protect your incision. It also decreases belly discomfort.  If you are being admitted to the hospital overnight, leave your suitcase in the car. After surgery it may be brought to your room.  If you are being discharged the day of surgery, you will not be allowed to drive home. You will need a responsible adult (  18 years or older) to drive you home and stay with you that night.   If you are taking public transportation, you will need to have a responsible adult (18 years or older) with you. Please confirm with your physician that it is acceptable to use public transportation.   Please call the Pre-admissions Testing Dept. at [phone] if you have any questions about these instructions.  Surgery Visitation Policy:  Patients undergoing a surgery or  procedure may have one family member or support person with them as long as that person is not COVID-19 positive or experiencing its symptoms.  That person may remain in the waiting area during the procedure.  Inpatient Visitation:    Visiting hours are 7 a.m. to 8 p.m. Inpatients will be allowed two visitors daily. The visitors may change each day during the patient's stay. No visitors under the age of 27. Any visitor under the age of 45 must be accompanied by an adult. The visitor must pass COVID-19 screenings, use hand sanitizer when entering and exiting the patient's room and wear a mask at all times, including in the patient's room. Patients must also wear a mask when staff or their visitor are in the room. Masking is required regardless of vaccination status.

## 2021-04-08 ENCOUNTER — Encounter
Admission: RE | Admit: 2021-04-08 | Discharge: 2021-04-08 | Disposition: A | Discharge status: Home / Self Care | Payer: Medicare Other | Source: Ambulatory Visit | Attending: Surgery | Admitting: Surgery

## 2021-04-08 ENCOUNTER — Other Ambulatory Visit: Payer: Medicare Other

## 2021-04-08 DIAGNOSIS — Z01810 Encounter for preprocedural cardiovascular examination: Secondary | ICD-10-CM | POA: Insufficient documentation

## 2021-04-09 MED ORDER — LACTATED RINGERS IV SOLN: INTRAVENOUS | Status: DC

## 2021-04-09 MED ORDER — CHLORHEXIDINE GLUCONATE CLOTH 2 % EX PADS: 6.0000 | MEDICATED_PAD | Freq: Once | CUTANEOUS | Status: DC

## 2021-04-09 MED ORDER — GABAPENTIN 300 MG PO CAPS: 300.0000 mg | ORAL_CAPSULE | ORAL | Status: AC

## 2021-04-09 MED ORDER — CELECOXIB 200 MG PO CAPS: 200.0000 mg | ORAL_CAPSULE | ORAL | Status: AC

## 2021-04-09 MED ORDER — ACETAMINOPHEN 500 MG PO TABS: 1000.0000 mg | ORAL_TABLET | ORAL | Status: AC

## 2021-04-09 MED ORDER — FAMOTIDINE 20 MG PO TABS: 20.0000 mg | ORAL_TABLET | Freq: Once | ORAL | Status: AC

## 2021-04-09 MED ORDER — CHLORHEXIDINE GLUCONATE 0.12 % MT SOLN: 15.0000 mL | Freq: Once | OROMUCOSAL | Status: AC

## 2021-04-09 MED ORDER — CEFAZOLIN SODIUM-DEXTROSE 2-4 GM/100ML-% IV SOLN
2.0000 g | INTRAVENOUS | Status: AC
  Administered 2021-04-10: 2 g via INTRAVENOUS

## 2021-04-09 MED ORDER — ORAL CARE MOUTH RINSE: 15.0000 mL | Freq: Once | OROMUCOSAL | Status: AC

## 2021-04-10 ENCOUNTER — Encounter
Admission: RE | Disposition: A | Discharge status: Home / Self Care | Payer: Self-pay | Source: Home / Self Care | Attending: Surgery

## 2021-04-10 ENCOUNTER — Ambulatory Visit
Admission: RE | Admit: 2021-04-10 | Discharge: 2021-04-10 | Disposition: A | Discharge status: Home / Self Care | Payer: Medicare Other | Attending: Surgery | Admitting: Surgery

## 2021-04-10 ENCOUNTER — Ambulatory Visit: Payer: Medicare Other | Admitting: Anesthesiology

## 2021-04-10 ENCOUNTER — Encounter: Payer: Self-pay | Admitting: Surgery

## 2021-04-10 ENCOUNTER — Other Ambulatory Visit: Payer: Self-pay

## 2021-04-10 DIAGNOSIS — Z87891 Personal history of nicotine dependence: Secondary | ICD-10-CM | POA: Insufficient documentation

## 2021-04-10 DIAGNOSIS — K4020 Bilateral inguinal hernia, without obstruction or gangrene, not specified as recurrent: Secondary | ICD-10-CM | POA: Diagnosis not present

## 2021-04-10 DIAGNOSIS — Z885 Allergy status to narcotic agent status: Secondary | ICD-10-CM | POA: Insufficient documentation

## 2021-04-10 HISTORY — PX: XI ROBOTIC ASSISTED INGUINAL HERNIA REPAIR WITH MESH: SHX6706

## 2021-04-10 SURGERY — REPAIR, HERNIA, INGUINAL, ROBOT-ASSISTED, LAPAROSCOPIC, USING MESH
Anesthesia: General | Site: Abdomen | Laterality: Bilateral

## 2021-04-10 MED ORDER — BUPIVACAINE-EPINEPHRINE (PF) 0.5% -1:200000 IJ SOLN
INTRAMUSCULAR | Status: AC
  Filled 2021-04-10: qty 30

## 2021-04-10 MED ORDER — BUPIVACAINE LIPOSOME 1.3 % IJ SUSP
INTRAMUSCULAR | Status: DC | PRN
  Administered 2021-04-10: 20 mL

## 2021-04-10 MED ORDER — TRAMADOL HCL 50 MG PO TABS
ORAL_TABLET | ORAL | Status: AC
  Filled 2021-04-10: qty 1

## 2021-04-10 MED ORDER — IBUPROFEN 800 MG PO TABS: 800.0000 mg | ORAL_TABLET | Freq: Three times a day (TID) | ORAL | 0 refills | Status: AC | PRN

## 2021-04-10 MED ORDER — PROMETHAZINE HCL 25 MG/ML IJ SOLN: 6.2500 mg | INTRAMUSCULAR | Status: DC | PRN

## 2021-04-10 MED ORDER — ACETAMINOPHEN 325 MG PO TABS: 650.0000 mg | ORAL_TABLET | Freq: Three times a day (TID) | ORAL | 0 refills | Status: AC | PRN

## 2021-04-10 MED ORDER — FENTANYL CITRATE (PF) 100 MCG/2ML IJ SOLN
INTRAMUSCULAR | Status: AC
  Filled 2021-04-10: qty 2

## 2021-04-10 MED ORDER — SUGAMMADEX SODIUM 200 MG/2ML IV SOLN
INTRAVENOUS | Status: DC | PRN
  Administered 2021-04-10: 150 mg via INTRAVENOUS

## 2021-04-10 MED ORDER — ACETAMINOPHEN 500 MG PO TABS
ORAL_TABLET | ORAL | Status: AC
  Administered 2021-04-10: 1000 mg via ORAL
  Filled 2021-04-10: qty 2

## 2021-04-10 MED ORDER — TRAMADOL HCL 50 MG PO TABS: 50.0000 mg | ORAL_TABLET | Freq: Four times a day (QID) | ORAL | 0 refills | Status: AC | PRN

## 2021-04-10 MED ORDER — CELECOXIB 200 MG PO CAPS
ORAL_CAPSULE | ORAL | Status: AC
  Administered 2021-04-10: 200 mg via ORAL
  Filled 2021-04-10: qty 1

## 2021-04-10 MED ORDER — ROCURONIUM BROMIDE 100 MG/10ML IV SOLN
INTRAVENOUS | Status: DC | PRN
  Administered 2021-04-10: 50 mg via INTRAVENOUS
  Administered 2021-04-10: 20 mg via INTRAVENOUS

## 2021-04-10 MED ORDER — CEFAZOLIN SODIUM-DEXTROSE 2-4 GM/100ML-% IV SOLN
INTRAVENOUS | Status: AC
  Filled 2021-04-10: qty 100

## 2021-04-10 MED ORDER — LIDOCAINE HCL (CARDIAC) PF 100 MG/5ML IV SOSY
PREFILLED_SYRINGE | INTRAVENOUS | Status: DC | PRN
  Administered 2021-04-10: 60 mg via INTRAVENOUS

## 2021-04-10 MED ORDER — DEXAMETHASONE SODIUM PHOSPHATE 10 MG/ML IJ SOLN
INTRAMUSCULAR | Status: DC | PRN
Start: 2021-04-10 — End: 2021-04-10
  Administered 2021-04-10: 10 mg via INTRAVENOUS

## 2021-04-10 MED ORDER — CHLORHEXIDINE GLUCONATE 0.12 % MT SOLN
OROMUCOSAL | Status: AC
  Administered 2021-04-10: 15 mL via OROMUCOSAL
  Filled 2021-04-10: qty 15

## 2021-04-10 MED ORDER — TRAMADOL HCL 50 MG PO TABS
50.0000 mg | ORAL_TABLET | Freq: Four times a day (QID) | ORAL | Status: DC | PRN
  Administered 2021-04-10: 50 mg via ORAL

## 2021-04-10 MED ORDER — BUPIVACAINE-EPINEPHRINE 0.5% -1:200000 IJ SOLN
INTRAMUSCULAR | Status: DC | PRN
  Administered 2021-04-10: 13 mL

## 2021-04-10 MED ORDER — BUPIVACAINE LIPOSOME 1.3 % IJ SUSP
INTRAMUSCULAR | Status: AC
  Filled 2021-04-10: qty 20

## 2021-04-10 MED ORDER — PROPOFOL 10 MG/ML IV BOLUS
INTRAVENOUS | Status: DC | PRN
  Administered 2021-04-10: 120 mg via INTRAVENOUS

## 2021-04-10 MED ORDER — MEPERIDINE HCL 25 MG/ML IJ SOLN: 6.2500 mg | INTRAMUSCULAR | Status: DC | PRN

## 2021-04-10 MED ORDER — FENTANYL CITRATE (PF) 100 MCG/2ML IJ SOLN
INTRAMUSCULAR | Status: DC | PRN
  Administered 2021-04-10 (×2): 25 ug via INTRAVENOUS
  Administered 2021-04-10: 50 ug via INTRAVENOUS

## 2021-04-10 MED ORDER — GLYCOPYRROLATE 0.2 MG/ML IJ SOLN
INTRAMUSCULAR | Status: DC | PRN
  Administered 2021-04-10: .2 mg via INTRAVENOUS

## 2021-04-10 MED ORDER — 0.9 % SODIUM CHLORIDE (POUR BTL) OPTIME
TOPICAL | Status: DC | PRN
  Administered 2021-04-10: 500 mL

## 2021-04-10 MED ORDER — MIDAZOLAM HCL 2 MG/2ML IJ SOLN
INTRAMUSCULAR | Status: AC
  Filled 2021-04-10: qty 2

## 2021-04-10 MED ORDER — FENTANYL CITRATE (PF) 100 MCG/2ML IJ SOLN
25.0000 ug | INTRAMUSCULAR | Status: DC | PRN
  Administered 2021-04-10 (×2): 25 ug via INTRAVENOUS

## 2021-04-10 MED ORDER — PROPOFOL 500 MG/50ML IV EMUL
INTRAVENOUS | Status: DC | PRN
  Administered 2021-04-10: 25 ug/kg/min via INTRAVENOUS

## 2021-04-10 MED ORDER — ONDANSETRON HCL 4 MG/2ML IJ SOLN
INTRAMUSCULAR | Status: DC | PRN
  Administered 2021-04-10: 4 mg via INTRAVENOUS

## 2021-04-10 MED ORDER — PHENYLEPHRINE HCL (PRESSORS) 10 MG/ML IV SOLN
INTRAVENOUS | Status: DC | PRN
  Administered 2021-04-10 (×2): 100 ug via INTRAVENOUS

## 2021-04-10 MED ORDER — FAMOTIDINE 20 MG PO TABS
ORAL_TABLET | ORAL | Status: AC
  Administered 2021-04-10: 20 mg via ORAL
  Filled 2021-04-10: qty 1

## 2021-04-10 MED ORDER — GABAPENTIN 300 MG PO CAPS
ORAL_CAPSULE | ORAL | Status: AC
  Administered 2021-04-10: 300 mg via ORAL
  Filled 2021-04-10: qty 1

## 2021-04-10 MED ORDER — DOCUSATE SODIUM 100 MG PO CAPS: 100.0000 mg | ORAL_CAPSULE | Freq: Two times a day (BID) | ORAL | 0 refills | Status: AC | PRN

## 2021-04-10 SURGICAL SUPPLY — 58 items
"PENCIL ELECTRO HAND CTR " (MISCELLANEOUS) ×1 IMPLANT
BAG INFUSER PRESSURE 100CC (MISCELLANEOUS) IMPLANT
BLADE CLIPPER SURG (BLADE) ×1 IMPLANT
BLADE SURG SZ11 CARB STEEL (BLADE) ×2 IMPLANT
BNDG GAUZE ELAST 4 BULKY (GAUZE/BANDAGES/DRESSINGS) ×1 IMPLANT
CHLORAPREP W/TINT 26 (MISCELLANEOUS) ×2 IMPLANT
CLIPPER SURGICAL HAIR RECHARGE (MISCELLANEOUS)
COVER TIP SHEARS 8 DVNC (MISCELLANEOUS) ×1 IMPLANT
COVER TIP SHEARS 8MM DA VINCI (MISCELLANEOUS) ×1
COVER WAND RF STERILE (DRAPES) ×1 IMPLANT
DEFOGGER SCOPE WARMER CLEARIFY (MISCELLANEOUS) ×2 IMPLANT
DERMABOND ADVANCED (GAUZE/BANDAGES/DRESSINGS) ×1
DERMABOND ADVANCED .7 DNX12 (GAUZE/BANDAGES/DRESSINGS) ×1 IMPLANT
DRAPE ARM DVNC X/XI (DISPOSABLE) ×3 IMPLANT
DRAPE COLUMN DVNC XI (DISPOSABLE) ×1 IMPLANT
DRAPE DA VINCI XI ARM (DISPOSABLE) ×3
DRAPE DA VINCI XI COLUMN (DISPOSABLE) ×1
ELECT CAUTERY BLADE 6.4 (BLADE) IMPLANT
ELECT REM PT RETURN 9FT ADLT (ELECTROSURGICAL) ×2
ELECTRODE REM PT RTRN 9FT ADLT (ELECTROSURGICAL) ×1 IMPLANT
GAUZE 4X4 16PLY ~~LOC~~+RFID DBL (SPONGE) ×2 IMPLANT
GLOVE SURG SYN 6.5 ES PF (GLOVE) ×4 IMPLANT
GLOVE SURG SYN 6.5 PF PI (GLOVE) ×2 IMPLANT
GLOVE SURG UNDER POLY LF SZ7 (GLOVE) ×4 IMPLANT
GOWN STRL REUS W/ TWL LRG LVL3 (GOWN DISPOSABLE) ×3 IMPLANT
GOWN STRL REUS W/TWL LRG LVL3 (GOWN DISPOSABLE) ×3
IRRIGATOR SUCT 8 DISP DVNC XI (IRRIGATION / IRRIGATOR) IMPLANT
IRRIGATOR SUCTION 8MM XI DISP (IRRIGATION / IRRIGATOR)
IV NS 1000ML (IV SOLUTION)
IV NS 1000ML BAXH (IV SOLUTION) IMPLANT
LABEL OR SOLS (LABEL) IMPLANT
MANIFOLD NEPTUNE II (INSTRUMENTS) ×2 IMPLANT
MESH 3DMAX 4X6 LT LRG (Mesh General) ×1 IMPLANT
MESH 3DMAX 4X6 RT LRG (Mesh General) ×1 IMPLANT
MESH 3DMAX MID 4X6 LT LRG (Mesh General) IMPLANT
MESH 3DMAX MID 4X6 RT LRG (Mesh General) IMPLANT
NDL INSUFFLATION 14GA 120MM (NEEDLE) ×1 IMPLANT
NEEDLE HYPO 22GX1.5 SAFETY (NEEDLE) ×2 IMPLANT
NEEDLE INSUFFLATION 14GA 120MM (NEEDLE) ×2 IMPLANT
OBTURATOR OPTICAL STANDARD 8MM (TROCAR) ×1
OBTURATOR OPTICAL STND 8 DVNC (TROCAR) ×1
OBTURATOR OPTICALSTD 8 DVNC (TROCAR) ×1 IMPLANT
PACK LAP CHOLECYSTECTOMY (MISCELLANEOUS) ×2 IMPLANT
PENCIL ELECTRO HAND CTR (MISCELLANEOUS) ×2 IMPLANT
SEAL CANN UNIV 5-8 DVNC XI (MISCELLANEOUS) ×3 IMPLANT
SEAL XI 5MM-8MM UNIVERSAL (MISCELLANEOUS) ×3
SET TUBE SMOKE EVAC HIGH FLOW (TUBING) ×2 IMPLANT
SOLUTION ELECTROLUBE (MISCELLANEOUS) ×2 IMPLANT
SURGICAL HAIR CLIPPER RECHARGE (MISCELLANEOUS) IMPLANT
SUT MNCRL 4-0 (SUTURE) ×1
SUT MNCRL 4-0 27XMFL (SUTURE) ×1
SUT VIC AB 2-0 SH 27 (SUTURE) ×2
SUT VIC AB 2-0 SH 27XBRD (SUTURE) ×1 IMPLANT
SUT VLOC 90 6 CV-15 VIOLET (SUTURE) ×4 IMPLANT
SUTURE MNCRL 4-0 27XMF (SUTURE) ×1 IMPLANT
SYR 30ML LL (SYRINGE) ×2 IMPLANT
TAPE TRANSPORE STRL 2 31045 (GAUZE/BANDAGES/DRESSINGS) ×1 IMPLANT
WATER STERILE IRR 500ML POUR (IV SOLUTION) ×1 IMPLANT

## 2021-04-10 NOTE — Anesthesia Postprocedure Evaluation (Signed)
Anesthesia Post Note  Patient: Alexander Velazquez  Procedure(s) Performed: XI ROBOTIC ASSISTED INGUINAL HERNIA REPAIR WITH MESH (Bilateral: Abdomen)  Patient location during evaluation: PACU Anesthesia Type: General Level of consciousness: awake and alert Pain management: pain level controlled Vital Signs Assessment: post-procedure vital signs reviewed and stable Respiratory status: spontaneous breathing, nonlabored ventilation, respiratory function stable and patient connected to nasal cannula oxygen Cardiovascular status: blood pressure returned to baseline and stable Postop Assessment: no apparent nausea or vomiting Anesthetic complications: no   No notable events documented.   Last Vitals:  Vitals:   04/10/21 1538 04/10/21 1546  BP: (!) 145/87 (!) 150/87  Pulse:  90  Resp: 16 12  Temp:    SpO2: 98% 95%    Last Pain:  Vitals:   04/10/21 1532  TempSrc:   PainSc: 0-No pain                 Yevette Edwards

## 2021-04-10 NOTE — Interval H&P Note (Signed)
History and Physical Interval Note:  04/10/2021 1:07 PM  Alexander Velazquez  has presented today for surgery, with the diagnosis of Non-recurrent unilateral inguinal hernia without obstruction or gangrene K40.90.  The various methods of treatment have been discussed with the patient and family. After consideration of risks, benefits and other options for treatment, the patient has consented to  Procedure(s): XI ROBOTIC ASSISTED INGUINAL HERNIA REPAIR WITH MESH (Left) as a surgical intervention.  The patient's history has been reviewed, patient examined, no change in status, stable for surgery.  I have reviewed the patient's chart and labs.  Questions were answered to the patient's satisfaction.     Neida Ellegood Tonna Boehringer

## 2021-04-10 NOTE — Progress Notes (Signed)
Up ambulating to bathroom. Able to void 150 ml's clear yellow urine. Pain level down to 5/10. Nausea has subsided. Voices readiness for discharge.

## 2021-04-10 NOTE — Transfer of Care (Signed)
Immediate Anesthesia Transfer of Care Note  Patient: Alexander Velazquez  Procedure(s) Performed: XI ROBOTIC ASSISTED INGUINAL HERNIA REPAIR WITH MESH (Bilateral: Abdomen)  Patient Location: PACU  Anesthesia Type:General  Level of Consciousness: drowsy and patient cooperative  Airway & Oxygen Therapy: Patient Spontanous Breathing  Post-op Assessment: Report given to RN and Post -op Vital signs reviewed and stable  Post vital signs: Reviewed and stable  Last Vitals:  Vitals Value Taken Time  BP 145/87 04/10/21 1538  Temp 36.1 C 04/10/21 1532  Pulse 88 04/10/21 1540  Resp 18 04/10/21 1540  SpO2 97 % 04/10/21 1540  Vitals shown include unvalidated device data.  Last Pain:  Vitals:   04/10/21 1532  TempSrc:   PainSc: 0-No pain         Complications: No notable events documented.

## 2021-04-10 NOTE — Op Note (Signed)
Preoperative diagnosis: Left inguinal Hernia.  Postoperative diagnosis: Bilateral inguinal Hernia  Procedure: Robotic assisted laparoscopic bilateral inguinal hernia repair with mesh  Anesthesia: General  Surgeon: Dr. Tonna Boehringer  Wound Classification: Clean  Specimen: none  Complications: None  Estimated Blood Loss: 73mL   Indications:  inguinal hernia. Repair was indicated to avoid complications of incarceration, obstruction and pain, and a prosthetic mesh repair was elected.  See H&P for further details.  Findings: 1. Vas Deferens and cord structures identified and preserved 2. Bard 3D max medium weight mesh used for repair 3. Adequate hemostasis achieved  Description of procedure: The patient was taken to the operating room. A time-out was completed verifying correct patient, procedure, site, positioning, and implant(s) and/or special equipment prior to beginning this procedure.  Area was prepped and draped in the usual sterile fashion. An incision was marked 20 cm above the pubic tubercle, slightly above the umbilicus  Scrotum wrapped in Kerlix roll.  Veress needle inserted at palmer's point.  Saline drop test noted to be positive with gradual increase in pressure after initiation of gas insufflation.  15 mm of pressure was achieved prior to removing the Veress needle and then placing a 8 mm port via the Optiview technique through the supraumbilical site.  Inspection of the area afterwards noted no injury to the surrounding organs during insertion of the needle and the port.  2 port sites were marked 8 cm to the lateral sides of the initial port, and a 8 mm robotic port was placed on the left side, another 8 mm robotic port on the right side under direct supervision.  Local anesthesia  infused to the preplanned incision sites prior to insertion of the port.  The [employer] platform was then brought into the operative field and docked to the ports.  Examination of the abdominal cavity  noted a bilateral inguinal hernia.  Left side addressed first. A peritoneal flap was created approximately 8cm cephalad to the defect by using scissors with electrocautery.  Dissection was carried down towards the pubic tubercle, developing the myopectineal orifice view.  Laterally the flap was carried towards the ASIS.  Small hernia sac was noted, which carefully dissected away from the adjacent tissues to be fully reduced out of hernia cavity.  Any bleeding was controlled with combination of electrocautery and manual pressure.    After confirming adequate dissection and the peritoneal reflection completely down and away from the cord structures, a Large Bard 3DMax medium weight mesh was placed within the anterior abdominal wall, secured in place using 2-0 Vicryl on an SH needle immediately above the pubic tubercle.  After noting proper placement of the mesh with the peritoneal reflection deep to it, the previously created peritoneal flap was secured back up to the anterior abdominal wall using running 3-0 V-Lock.  Both needles were then removed out of the abdominal cavity.  Right side addressed next.  peritoneal flap was created approximately 8cm cephalad to the defect by using scissors with electrocautery.  Dissection was carried down towards the pubic tubercle, developing the myopectineal orifice view.  Laterally the flap was carried towards the ASIS.  Small hernia sac was noted, which carefully dissected away from the adjacent tissues to be fully reduced out of hernia cavity.  Any bleeding was controlled with combination of electrocautery and manual pressure.    After confirming adequate dissection and the peritoneal reflection completely down and away from the cord structures, a Large Bard 3DMax medium weight mesh was placed within the  anterior abdominal wall, secured in place using 2-0 Vicryl on an SH needle immediately above the pubic tubercle.  After noting proper placement of the mesh with the  peritoneal reflection deep to it, the previously created peritoneal flap was secured back up to the anterior abdominal wall using running 3-0 V-Lock.  Both needles were then removed out of the abdominal cavity, Xi platform undocked from the ports and removed off of operative field.  exparel infused as ilioinguinal block.  Abdomen then desufflated and ports removed. All the skin incisions were then closed with a subcuticular stitch of Monocryl 4-0. Dermabond was applied. The testis was gently pulled down into its anatomic position in the scrotum.  The patient tolerated the procedure well and was taken to the postanesthesia care unit in stable condition. Sponge and instrument count correct at end of procedure.

## 2021-04-10 NOTE — Anesthesia Procedure Notes (Signed)
Procedure Name: Intubation Date/Time: 04/10/2021 1:40 PM Performed by: Elmarie Mainland, CRNA Pre-anesthesia Checklist: Patient identified, Emergency Drugs available, Suction available and Patient being monitored Patient Re-evaluated:Patient Re-evaluated prior to induction Oxygen Delivery Method: Circle system utilized Preoxygenation: Pre-oxygenation with 100% oxygen Induction Type: IV induction Ventilation: Mask ventilation without difficulty Laryngoscope Size: McGraph and 4 Grade View: Grade I Tube type: Oral Tube size: 7.0 mm Number of attempts: 1 Airway Equipment and Method: Stylet, Oral airway and Video-laryngoscopy Placement Confirmation: ETT inserted through vocal cords under direct vision, positive ETCO2 and breath sounds checked- equal and bilateral Secured at: 22 cm Tube secured with: Tape Dental Injury: Teeth and Oropharynx as per pre-operative assessment

## 2021-04-10 NOTE — Discharge Instructions (Signed)
AMBULATORY SURGERY  DISCHARGE INSTRUCTIONS   The drugs that you were given will stay in your system until tomorrow so for the next 24 hours you should not:  Drive an automobile Make any legal decisions Drink any alcoholic beverage   You may resume regular meals tomorrow.  Today it is better to start with liquids and gradually work up to solid foods.  You may eat anything you prefer, but it is better to start with liquids, then soup and crackers, and gradually work up to solid foods.   Please notify your doctor immediately if you have any unusual bleeding, trouble breathing, redness and pain at the surgery site, drainage, fever, or pain not relieved by medication.    Additional Instructions:  Keep green arm band on for 4 days  Please contact your physician with any problems or Same Day Surgery at [phone], Monday through Friday 6 am to 4 pm, or [place] [facility] number at [phone].  

## 2021-04-10 NOTE — Anesthesia Preprocedure Evaluation (Signed)
Anesthesia Evaluation  Patient identified by MRN, date of birth, ID band Patient awake    Reviewed: Allergy & Precautions, NPO status , Patient's Chart, lab work & pertinent test results  History of Anesthesia Complications Negative for: history of anesthetic complications  Airway Mallampati: III  TM Distance: >3 FB Neck ROM: Full    Dental  (+) Poor Dentition, Missing, Chipped   Pulmonary neg pulmonary ROS, neg sleep apnea, neg COPD,    breath sounds clear to auscultation- rhonchi (-) wheezing      Cardiovascular hypertension, Pt. on medications (-) CAD, (-) Past MI, (-) Cardiac Stents and (-) CABG  Rhythm:Regular Rate:Normal - Systolic murmurs and - Diastolic murmurs    Neuro/Psych neg Seizures Anxiety negative neurological ROS     GI/Hepatic Neg liver ROS, GERD  ,  Endo/Other  negative endocrine ROSneg diabetes  Renal/GU Renal disease: hx of nephrolithiasis.     Musculoskeletal negative musculoskeletal ROS (+)   Abdominal (+) - obese,   Peds  Hematology negative hematology ROS (+)   Anesthesia Other Findings Past Medical History: No date: Anxiety No date: GERD (gastroesophageal reflux disease) 09/13/2017: Hyperlipidemia, unspecified 09/13/2017: Hypertension No date: Nephrolithiasis   Reproductive/Obstetrics                             Anesthesia Physical Anesthesia Plan  ASA: 2  Anesthesia Plan: General   Post-op Pain Management:    Induction: Intravenous  PONV Risk Score and Plan: 1 and Ondansetron and Dexamethasone  Airway Management Planned: Oral ETT  Additional Equipment:   Intra-op Plan:   Post-operative Plan: Extubation in OR  Informed Consent: I have reviewed the patients History and Physical, chart, labs and discussed the procedure including the risks, benefits and alternatives for the proposed anesthesia with the patient or authorized representative who has  indicated his/her understanding and acceptance.     Dental advisory given  Plan Discussed with: CRNA and Anesthesiologist  Anesthesia Plan Comments:         Anesthesia Quick Evaluation

## 2021-04-11 ENCOUNTER — Encounter: Payer: Self-pay | Admitting: Surgery

## 2022-06-13 ENCOUNTER — Other Ambulatory Visit: Payer: Self-pay

## 2022-06-13 ENCOUNTER — Emergency Department: Payer: Medicare Other

## 2022-06-13 ENCOUNTER — Emergency Department
Admission: EM | Admit: 2022-06-13 | Discharge: 2022-06-13 | Disposition: A | Discharge status: Home / Self Care | Payer: Medicare Other | Attending: Emergency Medicine | Admitting: Emergency Medicine

## 2022-06-13 DIAGNOSIS — N410 Acute prostatitis: Secondary | ICD-10-CM | POA: Insufficient documentation

## 2022-06-13 DIAGNOSIS — I10 Essential (primary) hypertension: Secondary | ICD-10-CM | POA: Insufficient documentation

## 2022-06-13 DIAGNOSIS — E860 Dehydration: Secondary | ICD-10-CM | POA: Diagnosis not present

## 2022-06-13 DIAGNOSIS — R42 Dizziness and giddiness: Secondary | ICD-10-CM | POA: Insufficient documentation

## 2022-06-13 DIAGNOSIS — Z20822 Contact with and (suspected) exposure to covid-19: Secondary | ICD-10-CM | POA: Insufficient documentation

## 2022-06-13 DIAGNOSIS — R109 Unspecified abdominal pain: Secondary | ICD-10-CM | POA: Diagnosis present

## 2022-06-13 LAB — COMPREHENSIVE METABOLIC PANEL
ALT: 14 U/L (ref 0–44)
AST: 21 U/L (ref 15–41)
Albumin: 3.9 g/dL (ref 3.5–5.0)
Alkaline Phosphatase: 46 U/L (ref 38–126)
Anion gap: 7 (ref 5–15)
BUN: 22 mg/dL (ref 8–23)
CO2: 26 mmol/L (ref 22–32)
Calcium: 9.5 mg/dL (ref 8.9–10.3)
Chloride: 110 mmol/L (ref 98–111)
Creatinine, Ser: 1.19 mg/dL (ref 0.61–1.24)
GFR, Estimated: >60 mL/min (ref >60)
Glucose, Bld: 116 mg/dL — ABNORMAL HIGH (ref 70–99)
Potassium: 4 mmol/L (ref 3.5–5.1)
Sodium: 143 mmol/L (ref 135–145)
Total Bilirubin: 0.8 mg/dL (ref 0.3–1.2)
Total Protein: 7.5 g/dL (ref 6.5–8.1)

## 2022-06-13 LAB — URINALYSIS, ROUTINE W REFLEX MICROSCOPIC
Bilirubin Urine: NEGATIVE
Glucose, UA: NEGATIVE mg/dL
Hgb urine dipstick: NEGATIVE
Ketones, ur: 5 mg/dL — AB
Leukocytes,Ua: NEGATIVE
Nitrite: NEGATIVE
Protein, ur: NEGATIVE mg/dL
Specific Gravity, Urine: 1.021 (ref 1.005–1.030)
pH: 5 (ref 5.0–8.0)

## 2022-06-13 LAB — RESP PANEL BY RT-PCR (FLU A&B, COVID) ARPGX2
Influenza A by PCR: NEGATIVE
Influenza B by PCR: NEGATIVE
SARS Coronavirus 2 by RT PCR: NEGATIVE

## 2022-06-13 LAB — LIPASE, BLOOD: Lipase: 47 U/L (ref 11–51)

## 2022-06-13 LAB — CBC
HCT: 40.6 % (ref 39.0–52.0)
Hemoglobin: 13.6 g/dL (ref 13.0–17.0)
MCH: 30.4 pg (ref 26.0–34.0)
MCHC: 33.5 g/dL (ref 30.0–36.0)
MCV: 90.8 fL (ref 80.0–100.0)
Platelets: 215 10*3/uL (ref 150–400)
RBC: 4.47 MIL/uL (ref 4.22–5.81)
RDW: 12.4 % (ref 11.5–15.5)
WBC: 6.6 10*3/uL (ref 4.0–10.5)
nRBC: 0 % (ref 0.0–0.2)

## 2022-06-13 LAB — LACTIC ACID, PLASMA: Lactic Acid, Venous: 1.2 mmol/L (ref 0.5–1.9)

## 2022-06-13 MED ORDER — LACTATED RINGERS IV BOLUS
1000.0000 mL | Freq: Once | INTRAVENOUS | Status: AC
  Administered 2022-06-13: 1000 mL via INTRAVENOUS

## 2022-06-13 MED ORDER — TAMSULOSIN HCL 0.4 MG PO CAPS: 0.4000 mg | ORAL_CAPSULE | Freq: Every day | ORAL | 0 refills | Status: AC

## 2022-06-13 MED ORDER — CIPROFLOXACIN HCL 500 MG PO TABS: 500.0000 mg | ORAL_TABLET | Freq: Two times a day (BID) | ORAL | 0 refills | Status: AC

## 2022-06-13 NOTE — ED Triage Notes (Signed)
Woke up this morning with dizziness, abd pain, and nausea. Denies chest pain or SOB. Pt pale in triage. Alert and oriented. Breathing unlabored with symmetric chest rise and fall.

## 2022-06-13 NOTE — ED Provider Notes (Signed)
[facility] Provider Note    Event Date/Time   First MD Initiated Contact with Patient 06/13/22 [phone]     (approximate)   History   Abdominal Pain and Dizziness   HPI  Alexander Velazquez is a 76 y.o. male  here with abdominal pain, dizziness. Pt reports he was in his usual state of health until this morning. He went to let his dogs out at around 1 AM and felt fine. He felt fine going to bed. Shortly after getting back in bed, he experienced acute onset of chills, body aches, nausea, dizziness, and abdominal cramping. He's had some persistent chills since then. The dizziness did feel like the room was spinning somewhat, worse when he lies down and changes positions, resolves when still. He has chronic tinnitus but no known h/o vertigo. Denies any associated difficulty speaking or swallowing. No focal numbness or weakness. Reports he has had persistent chills, weakness since onset. He dose note some increasing urinary frequency and mild dysuria, but not acutely worsened. Has not seen a Insurance underwriter. No known sick contacts in the home. No cough. No CP.       Physical Exam   Triage Vital Signs: ED Triage Vitals  Enc Vitals Group     BP 06/13/22 0703 (!) 189/86     Pulse Rate 06/13/22 0701 85     Resp 06/13/22 0701 18     Temp 06/13/22 0701 98.7 F (37.1 C)     Temp Source 06/13/22 0701 Oral     SpO2 06/13/22 0701 99 %     Weight 06/13/22 0659 155 lb (70.3 kg)     Height 06/13/22 0659 5\' 7"  (1.702 m)     Head Circumference --      Peak Flow --      Pain Score 06/13/22 0659 4     Pain Loc --      Pain Edu? --      Excl. in GC? --     Most recent vital signs: Vitals:   06/13/22 0703 06/13/22 0827  BP: (!) 189/86   Pulse:    Resp:    Temp:  98 F (36.7 C)  SpO2:       General: Awake, no distress.  CV:  Good peripheral perfusion. RRR. 2/6 systolic murmur appreciated best heard in RUSB. Resp:  Normal effort. Lungs CTAB. Abd:  No distention. No significant  TTP. Other:  Occasional rigors noted, no tremors and normal tone throughout however. CNII-XII intact. Strength 5/5 bl UE and LE. Gait normal, no ataxia on FTN testing. Sensation intact to light touch b/l UE and LE.   ED Results / Procedures / Treatments   Labs (all labs ordered are listed, but only abnormal results are displayed) Labs Reviewed  COMPREHENSIVE METABOLIC PANEL - Abnormal; Notable for the following components:      Result Value   Glucose, Bld 116 (*)    All other components within normal limits  URINALYSIS, ROUTINE W REFLEX MICROSCOPIC - Abnormal; Notable for the following components:   Color, Urine YELLOW (*)    APPearance HAZY (*)    Ketones, ur 5 (*)    All other components within normal limits  RESP PANEL BY RT-PCR (FLU A&B, COVID) ARPGX2  LIPASE, BLOOD  CBC  LACTIC ACID, PLASMA  LACTIC ACID, PLASMA     EKG Normal sinus rhythm, VR 93. PR 168, QRS 78, QTc 457. No acute ST elevations or depressions. No ischemia or infarct.   RADIOLOGY  CT Head: Negative, naica CT Stone: Negative for acute abnormality, L nephrolithiasis, multiple renal cysts, prostatomegaly CXR: Clear=   I also independently reviewed and agree with radiologist interpretations.   PROCEDURES:  Critical Care performed: No  .1-3 Lead EKG Interpretation  Performed by: Shaune Pollack, MD Authorized by: Shaune Pollack, MD     Interpretation: normal     ECG rate:  75-90   ECG rate assessment: normal     Rhythm: sinus rhythm     Ectopy: none     Conduction: normal   Comments:     Indication: SOB, nausea, age>65    MEDICATIONS ORDERED IN ED: Medications  lactated ringers bolus 1,000 mL (1,000 mLs Intravenous New Bag/Given 06/13/22 0831)     IMPRESSION / MDM / ASSESSMENT AND PLAN / ED COURSE  I reviewed the triage vital signs and the nursing notes.                              This patient presents to the ED for concern of SEPSIS, POSSIBLE VERTIGO/STROKE, this involves an  extensive number of treatment options, and is a complaint that carries with it a high risk of complications and morbidity.  The differential diagnosis includes UTI, prostatitis, infected renal stone, diverticulitis, enteritis, SBO; peripheral vertigo, orthostasis, central vertigo   Co morbidities that complicate the patient evaluation  Age HTN HLD   Additional history obtained:  Additional history obtained from Wife External records from outside source obtained and reviewed including : None   Lab Tests:  I Ordered, and personally interpreted labs.  The pertinent results include:  normal WBC, normal renal function, UA with ketonuria but no major pyuria or bacteriuria   Imaging Studies ordered:  I ordered imaging studies including CT Head, Abd/Pelvis, CXR  I independently visualized and interpreted imaging which showed no acute abnormalities, CT Stone did show prostatomegaly and stones (non obstructive) I agree with the radiologist interpretation  Problem List / ED Course / Critical interventions / Medication management  Chills/nausea/lower abd pain: clinically high suspicion for prostatitis with dysuria, frequency, prostatomegaly and suprapubic discomfort. No signs of testicular abnormality. Will cover empirically and refer to Urology. Flomax started. No fever here, no signs of sepsis. Dizziness is likely from orthostasis versus symptoms related to his transient rigors; he has no cerebellar abnormalities on exam, no nystagmus, no focal neuro deficits and neg CT head I ordered medication including IVF for dehydration. Reevaluation of the patient after these medicines showed that the patient improved I have reviewed the patients home medicines and have made adjustments as needed   Social Determinants of Health:  No significant   Test / Admission - Considered:  Does not meet sepsis criteria so do not feel admission needed Dizziness does not fit w/ CVA or central etiology - can  manage as outpt   FINAL CLINICAL IMPRESSION(S) / ED DIAGNOSES   Final diagnoses:  Acute prostatitis  Dizziness  Dehydration     Rx / DC Orders   ED Discharge Orders          Ordered    ciprofloxacin (CIPRO) 500 MG tablet  2 times daily        06/13/22 0947    tamsulosin (FLOMAX) 0.4 MG CAPS capsule  Daily after supper        06/13/22 0947             Note:  This document was prepared using Dragon voice recognition  software and may include unintentional dictation errors.   Duffy Bruce, MD 06/13/22 [phone]

## 2022-07-07 ENCOUNTER — Encounter: Payer: Self-pay | Admitting: Urology

## 2022-07-07 ENCOUNTER — Ambulatory Visit (INDEPENDENT_AMBULATORY_CARE_PROVIDER_SITE_OTHER): Payer: Medicare Other | Admitting: Urology

## 2022-07-07 VITALS — BP 183/81 | HR 89 | Ht 66.0 in | Wt 155.0 lb

## 2022-07-07 DIAGNOSIS — Z87438 Personal history of other diseases of male genital organs: Secondary | ICD-10-CM

## 2022-07-07 DIAGNOSIS — N401 Enlarged prostate with lower urinary tract symptoms: Secondary | ICD-10-CM

## 2022-07-07 DIAGNOSIS — N419 Inflammatory disease of prostate, unspecified: Secondary | ICD-10-CM

## 2022-07-07 LAB — BLADDER SCAN AMB NON-IMAGING: Scan Result: 0

## 2022-07-07 NOTE — Progress Notes (Unsigned)
07/07/2022 10:39 AM   Alexander Velazquez 08-10-1945 [id number]  Referring provider: Jerl Mina, MD [address] [facility] [place],  [place] [address]  Chief Complaint  Patient presents with   Other    HPI: Alexander Velazquez is a 76 y.o. male who presents for follow-up of recent ED visit.  Seen [facility] ED 06/13/2022 with complaints of chills, body aches, nausea gnosis and lower abdominal cramping associated with mild frequency and dysuria Dipstick urinalysis was negative Noncontrast CT abdomen pelvis showed bilateral renal cysts and prostate enlargement He was felt to have prostatitis and was treated with a 10-day course of Cipro and started on tamsulosin Urine culture was not ordered States he is feeling much better and has no complaints.  He has 10 days of tamsulosin left Prior history of stone disease and underwent SWL 09/2017   PMH: Past Medical History:  Diagnosis Date   Anxiety    GERD (gastroesophageal reflux disease)    Hyperlipidemia, unspecified 09/13/2017   Hypertension 09/13/2017   Nephrolithiasis     Surgical History: Past Surgical History:  Procedure Laterality Date   EXTRACORPOREAL SHOCK WAVE LITHOTRIPSY  2011   EXTRACORPOREAL SHOCK WAVE LITHOTRIPSY Left 09/15/2017   Procedure: EXTRACORPOREAL SHOCK WAVE LITHOTRIPSY (ESWL);  Surgeon: Riki Altes, MD;  Location: [facility] ORS;  Service: Urology;  Laterality: Left;   XI ROBOTIC ASSISTED INGUINAL HERNIA REPAIR WITH MESH Bilateral 04/10/2021   Procedure: XI ROBOTIC ASSISTED INGUINAL HERNIA REPAIR WITH MESH;  Surgeon: Sung Amabile, DO;  Location: [facility] ORS;  Service: General;  Laterality: Bilateral;    Home Medications:  Allergies as of 07/07/2022       Reactions   Codeine Nausea And Vomiting   Darvon [propoxyphene] Nausea And Vomiting        Medication List        Accurate as of July 07, 2022 10:39 AM. If you have any questions, ask your nurse or doctor.          B-12 2500 MCG Tabs Take 2,500 mcg by  mouth daily.   cholecalciferol 25 MCG (1000 UNIT) tablet Commonly known as: VITAMIN D3 Take 2,000 Units by mouth daily.   Fish Oil 1000 MG Caps Take 2,000 mg by mouth daily.   ibuprofen 800 MG tablet Commonly known as: ADVIL Take 1 tablet (800 mg total) by mouth every 8 (eight) hours as needed for mild pain or moderate pain.   multivitamin with minerals Tabs tablet Take 1 tablet by mouth daily.   OVER THE COUNTER MEDICATION Take 1 capsule by mouth daily. Health Feet and Nerve   tamsulosin 0.4 MG Caps capsule Commonly known as: Flomax Take 1 capsule (0.4 mg total) by mouth daily after supper.        Allergies:  Allergies  Allergen Reactions   Codeine Nausea And Vomiting   Darvon [Propoxyphene] Nausea And Vomiting    Family History: Family History  Problem Relation Age of Onset   Prostate cancer Neg Hx    Bladder Cancer Neg Hx    Kidney cancer Neg Hx     Social History:  reports that he has never smoked. He has never used smokeless tobacco. He reports that he does not drink alcohol and does not use drugs.   Physical Exam: BP (!) 183/81   Pulse 89   Ht 5\' 6"  (1.676 m)   Wt 155 lb (70.3 kg)   BMI 25.02 kg/m   Constitutional:  Alert and oriented, No acute distress. HEENT: [place] AT Respiratory:  Normal respiratory effort, no increased work of breathing. GI: Abdomen is soft, nontender, nondistended, no abdominal masses GU: Prostate 50 g, smooth without nodules Skin: No rashes, bruises or suspicious lesions. Neurologic: Grossly intact, no focal deficits, moving all 4 extremities. Psychiatric: Normal mood and affect.  Laboratory Data:  Urinalysis Dipstick/coloscopy negative   Pertinent Imaging: CT images were personally reviewed and interpreted.  Prostate volume calculated at 40-50 cc   CT Renal Stone Study  Narrative CLINICAL DATA:  Flank pain, kidney stone suspected  EXAM: CT RENAL STONE PROTOCOL  TECHNIQUE: Multidetector CT imaging of the abdomen  and pelvis was performed following the standard protocol without IV contrast.  RADIATION DOSE REDUCTION: This exam was performed according to the departmental dose-optimization program which includes automated exposure control, adjustment of the mA and/or kV according to patient size and/or use of iterative reconstruction technique.  COMPARISON:  CT AP, 09/07/2017.  FINDINGS: Lower chest: No acute abnormality.  Hepatobiliary: No focal liver abnormality is seen. No gallstones, gallbladder wall thickening, or biliary dilatation.  Pancreas: No pancreatic ductal dilatation or surrounding inflammatory changes.  Spleen: Normal in size without focal abnormality.  Adrenals/Urinary Tract:  *Adrenal glands are unremarkable. *Bilateral renal hypodense cystic lesions, largest measuring 2.0 cm RIGHT and 3.5 cm LEFT. *Punctate LEFT nonobstructing nephroliths.  No hydronephrosis. *Bladder is unremarkable.  Stomach/Bowel: Stomach is within normal limits. Appendix appears normal. Severe burden of sigmoid diverticulosis. No evidence of bowel wall thickening, distention, or inflammatory changes.  Vascular/Lymphatic: Moderate burden of aortic atherosclerosis with ectatic mid abdominal aorta, measuring 2.8 cm. No enlarged abdominal or pelvic lymph nodes.  Reproductive: Prostatomegaly measuring 5.5 cm  Other: Small fat-containing umbilical hernia. No abdominopelvic ascites.  Musculoskeletal: No acute or significant osseous findings.  IMPRESSION: 1. Punctate LEFT nonobstructing nephrolithiasis. 2. 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 No follow-up imaging is recommended. JACR 2018 Feb; 264-273, Management of the Incidental Renal Mass on CT, [employer] 2021; 814-848, Bosniak Classification of Cystic Renal Masses, Version 2019. 3. 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 No follow-up imaging is recommended. Reference: J Am Coll Radiol  2013;10:789-794. Aortic Atherosclerosis (ICD10-I70.0). 4. Prostatomegaly. Additional incidental, chronic and senescent findings as above.   Electronically Signed By: Roanna Banning M.D. On: 06/13/2022 08:46   Assessment & Plan:    1.  BPH with LUTS UA findings did not support UTI though microscopic examination and urine culture was not ordered Symptoms have resolved He was not having bothersome voiding symptoms prior to this episode and does not desire to continue tamsulosin once he finishes his current Rx He was informed should his voiding symptoms worsen after stopping the tamsulosin to call back for an Rx   Riki Altes, MD  [facility] [address] [place], [place] [address] [phone]

## 2022-07-08 ENCOUNTER — Encounter: Payer: Self-pay | Admitting: Urology

## 2022-07-08 LAB — URINALYSIS, COMPLETE
Bilirubin, UA: NEGATIVE
Glucose, UA: NEGATIVE
Leukocytes,UA: NEGATIVE
Nitrite, UA: NEGATIVE
RBC, UA: NEGATIVE
Specific Gravity, UA: 1.025 (ref 1.005–1.030)
Urobilinogen, Ur: 0.2 mg/dL (ref 0.2–1.0)
pH, UA: 5 (ref 5.0–7.5)

## 2022-07-08 LAB — MICROSCOPIC EXAMINATION: Bacteria, UA: NONE SEEN
